# Patient Record
Sex: Female | Born: 1995 | Race: White | Hispanic: No | Marital: Married | Smoking: Never smoker
Health system: Southern US, Community
[De-identification: ages and names within clinical notes are randomized; demographics above are authoritative.]

## PROBLEM LIST (undated history)

## (undated) ENCOUNTER — Inpatient Hospital Stay: Payer: Self-pay

## (undated) DIAGNOSIS — G43909 Migraine, unspecified, not intractable, without status migrainosus: Secondary | ICD-10-CM

## (undated) DIAGNOSIS — R0989 Other specified symptoms and signs involving the circulatory and respiratory systems: Secondary | ICD-10-CM

## (undated) DIAGNOSIS — E282 Polycystic ovarian syndrome: Secondary | ICD-10-CM

## (undated) DIAGNOSIS — R202 Paresthesia of skin: Secondary | ICD-10-CM

## (undated) DIAGNOSIS — O24419 Gestational diabetes mellitus in pregnancy, unspecified control: Secondary | ICD-10-CM

## (undated) DIAGNOSIS — R200 Anesthesia of skin: Secondary | ICD-10-CM

## (undated) HISTORY — DX: Gestational diabetes mellitus in pregnancy, unspecified control: O24.419

## (undated) HISTORY — DX: Polycystic ovarian syndrome: E28.2

## (undated) HISTORY — DX: Paresthesia of skin: R20.2

## (undated) HISTORY — DX: Other specified symptoms and signs involving the circulatory and respiratory systems: R09.89

## (undated) HISTORY — DX: Migraine, unspecified, not intractable, without status migrainosus: G43.909

## (undated) HISTORY — DX: Anesthesia of skin: R20.0

---
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
## 2015-02-15 ENCOUNTER — Encounter: Payer: Self-pay | Admitting: Emergency Medicine

## 2015-02-15 ENCOUNTER — Ambulatory Visit
Admission: EM | Admit: 2015-02-15 | Discharge: 2015-02-15 | Disposition: A | Discharge status: Other Acute Inpatient Hospital | Payer: BLUE CROSS/BLUE SHIELD | Attending: Family Medicine | Admitting: Family Medicine

## 2015-02-15 ENCOUNTER — Emergency Department: Payer: BLUE CROSS/BLUE SHIELD

## 2015-02-15 ENCOUNTER — Emergency Department
Admission: EM | Admit: 2015-02-15 | Discharge: 2015-02-15 | Disposition: A | Discharge status: Home / Self Care | Payer: BLUE CROSS/BLUE SHIELD | Attending: Emergency Medicine | Admitting: Emergency Medicine

## 2015-02-15 DIAGNOSIS — N939 Abnormal uterine and vaginal bleeding, unspecified: Secondary | ICD-10-CM

## 2015-02-15 DIAGNOSIS — R102 Pelvic and perineal pain: Secondary | ICD-10-CM

## 2015-02-15 DIAGNOSIS — Z880 Allergy status to penicillin: Secondary | ICD-10-CM | POA: Insufficient documentation

## 2015-02-15 DIAGNOSIS — N938 Other specified abnormal uterine and vaginal bleeding: Secondary | ICD-10-CM

## 2015-02-15 LAB — WET PREP, GENITAL
Clue Cells Wet Prep HPF POC: NONE SEEN
SPERM: NONE SEEN
TRICH WET PREP: NONE SEEN
Yeast Wet Prep HPF POC: NONE SEEN

## 2015-02-15 LAB — CBC WITH DIFFERENTIAL/PLATELET
BASOS ABS: 0.1 10*3/uL (ref 0–0.1)
BASOS PCT: 1 %
EOS PCT: 0 %
Eosinophils Absolute: 0 10*3/uL (ref 0–0.7)
HCT: 43.8 % (ref 35.0–47.0)
Hemoglobin: 14.8 g/dL (ref 12.0–16.0)
Lymphocytes Relative: 19 %
Lymphs Abs: 1.8 10*3/uL (ref 1.0–3.6)
MCH: 30.2 pg (ref 26.0–34.0)
MCHC: 33.8 g/dL (ref 32.0–36.0)
MCV: 89.4 fL (ref 80.0–100.0)
MONO ABS: 0.6 10*3/uL (ref 0.2–0.9)
Monocytes Relative: 6 %
NEUTROS ABS: 6.8 10*3/uL — AB (ref 1.4–6.5)
Neutrophils Relative %: 74 %
PLATELETS: 228 10*3/uL (ref 150–440)
RBC: 4.9 MIL/uL (ref 3.80–5.20)
RDW: 13.2 % (ref 11.5–14.5)
WBC: 9.3 10*3/uL (ref 3.6–11.0)

## 2015-02-15 LAB — BASIC METABOLIC PANEL
ANION GAP: 7 (ref 5–15)
BUN: 9 mg/dL (ref 6–20)
CALCIUM: 9.4 mg/dL (ref 8.9–10.3)
CO2: 25 mmol/L (ref 22–32)
Chloride: 107 mmol/L (ref 101–111)
Creatinine, Ser: 0.63 mg/dL (ref 0.44–1.00)
Glucose, Bld: 92 mg/dL (ref 65–99)
Potassium: 4 mmol/L (ref 3.5–5.1)
Sodium: 139 mmol/L (ref 135–145)

## 2015-02-15 LAB — URINALYSIS COMPLETE WITH MICROSCOPIC (ARMC ONLY)
BILIRUBIN URINE: NEGATIVE
Bacteria, UA: NONE SEEN
GLUCOSE, UA: NEGATIVE mg/dL
Ketones, ur: NEGATIVE mg/dL
LEUKOCYTES UA: NEGATIVE
NITRITE: NEGATIVE
Protein, ur: NEGATIVE mg/dL
SPECIFIC GRAVITY, URINE: 1.013 (ref 1.005–1.030)
pH: 8 (ref 5.0–8.0)

## 2015-02-15 LAB — CHLAMYDIA/NGC RT PCR (ARMC ONLY)
Chlamydia Tr: NOT DETECTED
N GONORRHOEAE: NOT DETECTED

## 2015-02-15 LAB — PREGNANCY, URINE: PREG TEST UR: NEGATIVE

## 2015-02-15 MED ORDER — OXYCODONE-ACETAMINOPHEN 5-325 MG PO TABS
1.0000 | ORAL_TABLET | Freq: Once | ORAL | Status: AC
  Administered 2015-02-15: 1 via ORAL
  Filled 2015-02-15: qty 1

## 2015-02-15 NOTE — ED Notes (Signed)
Pharmacy called and notified of need of percocet/roxicet. States they will restock it.

## 2015-02-15 NOTE — ED Provider Notes (Signed)
Patient presents today with symptoms of left-sided pelvic cramping and vaginal bleeding. She has been passing large blood clots. Patient states that the symptoms started earlier this morning. She states that she does have a history of "cysts on her ovaries" which she often gets pain from but doesn't have bleeding from.  he states this was discovered when she had her appendectomy. She states her last menstrual period was on 02/03/2015 and lasted to 02/07/2015. She uses the NuvaRing for contraception. She admits to having periods every month. After the incident that happened earlier this morning she took the neighboring out thinking it was a result of the NuvaRing. She denies using any other medications. She is sexually active. She denies any chest pain, shortness of breath, nausea, vomiting, diarrhea, severe abdominal pain or flank pain, urinary symptoms, vaginal discharge.  ROS: Negative except mentioned above.  Vitals as per Epic.  GENERAL: NAD RESP: CTA B CARD: RRR ABD: +BS, mild tenderness along left pelvic area, no rebound or guarding  NEURO: CN II-XII grossly intact   A/P: Pelvic Pain, Bleeding- Recommend transvaginal US to evaluate further. Patient will have to go to the ER to have this done and for any further workup/treatment. Patient will go at this time to [facility].   Jolene Provost, MD 02/15/15 1352

## 2015-02-15 NOTE — ED Notes (Addendum)
Pt had normal menstrual cycle last week.  Then began with lower abdominal cramping today and vaginal bleeding.  Pt has used 4 pads today.  Has seen clots.  Pt was sent from [facility] urgent care for [place].

## 2015-02-15 NOTE — ED Provider Notes (Signed)
[facility] Emergency Department Provider Note  ____________________________________________  Time seen: Approximately 540 PM  I have reviewed the triage vital signs and the nursing notes.   HISTORY  Chief Complaint Vaginal Bleeding    HPI Christine Arroyo is a 20 y.o. female with a history of an appendectomy and ovarian cysts who is presenting with left lower quadrant cramping abdominal pain and vaginal bleeding since this morning. She says that she has been passing clots on and off all day today. She denies any chest pain, shortness of breath or feeling that she will pass out. She says that she had a normal period earlier this month from the ninth through the 13th. She uses her NuvaRing as directed and change that every month. After she began having bleeding this morning though she did take out the nuvaring.  She says the pain is a 6 out of 10 and has been radiating down her left leg.   History reviewed. No pertinent past medical history.  There are no active problems to display for this patient.   Past Surgical History  Procedure Laterality Date  . Appendectomy      Current Outpatient Rx  Name  Route  Sig  Dispense  Refill  . etonogestrel-ethinyl estradiol (NUVARING) 0.12-0.015 MG/24HR vaginal ring   Vaginal   Place 1 each vaginally every 28 (twenty-eight) days. Insert vaginally and leave in place for 3 consecutive weeks, then remove for 1 week.           Allergies Amoxicillin; Azithromycin; Keflex; Morphine and related; Penicillins; and Robitussin (alcohol free)  History reviewed. No pertinent family history.  Social History Social History  Substance Use Topics  . Smoking status: Never Smoker   . Smokeless tobacco: None  . Alcohol Use: No    Review of Systems Constitutional: No fever/chills Eyes: No visual changes. ENT: No sore throat. Cardiovascular: Denies chest pain. Respiratory: Denies shortness of breath. Gastrointestinal:  No  nausea, no vomiting.  No diarrhea.  No constipation. Genitourinary: Negative for dysuria. Musculoskeletal: Negative for back pain. Skin: Negative for rash. Neurological: Negative for headaches, focal weakness or numbness.  10-point ROS otherwise negative.  ____________________________________________   PHYSICAL EXAM:  VITAL SIGNS: ED Triage Vitals  Enc Vitals Group     BP 02/15/15 1424 131/78 mmHg     Pulse Rate 02/15/15 1424 75     Resp 02/15/15 1424 16     Temp 02/15/15 1424 98.3 F (36.8 C)     Temp Source 02/15/15 1424 Oral     SpO2 02/15/15 1424 100 %     Weight 02/15/15 1424 122 lb (55.339 kg)     Height 02/15/15 1424  (1.575 m)     Head Cir --      Peak Flow --      Pain Score 02/15/15 1431 6     Pain Loc --      Pain Edu? --      Excl. in GC? --     Constitutional: Alert and oriented. Well appearing and in no acute distress. Eyes: Conjunctivae are normal. PERRL. EOMI. Head: Atraumatic. Nose: No congestion/rhinnorhea. Mouth/Throat: Mucous membranes are moist.  Neck: No stridor.   Cardiovascular: Normal rate, regular rhythm. Grossly normal heart sounds.  Good peripheral circulation. Respiratory: Normal respiratory effort.  No retractions. Lungs CTAB. Gastrointestinal: Soft with tenderness palpation to the left lower quadrant. No distention. No abdominal bruits. No CVA tenderness. Genitourinary: Normal external exam. Speculum exam with very small amount of blood  in the vault. No active bleeding from the cervix. Bimanual exam without cervical motion tenderness. No uterine or right adnexal tenderness to palpation. Left adnexal tenderness to palpation without a palpable mass. Musculoskeletal: No lower extremity tenderness nor edema.  No joint effusions. Neurologic:  Normal speech and language. No gross focal neurologic deficits are appreciated. No gait instability. Skin:  Skin is warm, dry and intact. No rash noted. Psychiatric: Mood and affect are normal. Speech  and behavior are normal.  ____________________________________________   LABS (all labs ordered are listed, but only abnormal results are displayed)  Labs Reviewed  WET PREP, GENITAL - Abnormal; Notable for the following:    WBC, Wet Prep HPF POC MODERATE (*)    All other components within normal limits  CBC WITH DIFFERENTIAL/PLATELET - Abnormal; Notable for the following:    Neutro Abs 6.8 (*)    All other components within normal limits  URINALYSIS COMPLETEWITH MICROSCOPIC ([facility] ONLY) - Abnormal; Notable for the following:    Color, Urine YELLOW (*)    APPearance HAZY (*)    Hgb urine dipstick 3+ (*)    Squamous Epithelial / LPF 6-30 (*)    All other components within normal limits  CHLAMYDIA/NGC RT PCR ([facility] ONLY)  BASIC METABOLIC PANEL   ____________________________________________  EKG   ____________________________________________  RADIOLOGY  Normal pelvic ultrasound. ____________________________________________   PROCEDURES   ____________________________________________   INITIAL IMPRESSION / ASSESSMENT AND PLAN / ED COURSE  Pertinent labs & imaging results that were available during my care of the patient were reviewed by me and considered in my medical decision making (see chart for details).  ----------------------------------------- 9:34 PM on 02/15/2015 -----------------------------------------     Patient is resting without any distress at this time. Consultation about her lab results as well as her imaging results. I believe she is having dysfunctional uterine bleeding. The patient says she has not passed any more clots and she has been here in the emergency department. We will give her pain medication as requested before she leaves. However, I advised her to take ibuprofen at home for pain relief. Patient understands that she should follow-up with her OB/GYN at [facility] as soon as  possible. ____________________________________________   FINAL CLINICAL IMPRESSION(S) / ED DIAGNOSES  Final diagnoses:  Female pelvic pain  Female pelvic pain   dysfunctional uterine bleeding.    Myrna Blazer, MD 02/15/15 2137

## 2015-02-15 NOTE — ED Notes (Signed)
Patient c/o abdominal cramps and vaginal bleeding that started this morning.  Patient reports that she had her menstrual cycle last week and was a normal cycle.  Patient reports passing golf sized blood clots this morning.

## 2015-07-18 ENCOUNTER — Other Ambulatory Visit: Payer: Self-pay | Admitting: Neurology

## 2015-07-18 DIAGNOSIS — R202 Paresthesia of skin: Principal | ICD-10-CM

## 2015-07-18 DIAGNOSIS — R200 Anesthesia of skin: Secondary | ICD-10-CM

## 2015-08-05 ENCOUNTER — Ambulatory Visit: Payer: BLUE CROSS/BLUE SHIELD | Admitting: Internal Medicine

## 2015-08-08 ENCOUNTER — Other Ambulatory Visit: Payer: BLUE CROSS/BLUE SHIELD

## 2015-08-08 ENCOUNTER — Ambulatory Visit: Payer: BLUE CROSS/BLUE SHIELD

## 2015-08-12 ENCOUNTER — Inpatient Hospital Stay: Payer: BLUE CROSS/BLUE SHIELD | Attending: Internal Medicine | Admitting: Internal Medicine

## 2015-08-12 ENCOUNTER — Encounter: Payer: Self-pay | Admitting: Internal Medicine

## 2015-08-12 VITALS — BP 113/79 | HR 69 | Temp 99.0°F | Resp 18 | Wt 126.8 lb

## 2015-08-12 DIAGNOSIS — R2681 Unsteadiness on feet: Secondary | ICD-10-CM | POA: Insufficient documentation

## 2015-08-12 DIAGNOSIS — D890 Polyclonal hypergammaglobulinemia: Secondary | ICD-10-CM | POA: Diagnosis not present

## 2015-08-12 DIAGNOSIS — R200 Anesthesia of skin: Secondary | ICD-10-CM | POA: Diagnosis not present

## 2015-08-12 DIAGNOSIS — R202 Paresthesia of skin: Secondary | ICD-10-CM | POA: Diagnosis not present

## 2015-08-12 DIAGNOSIS — Z803 Family history of malignant neoplasm of breast: Secondary | ICD-10-CM | POA: Insufficient documentation

## 2015-08-12 NOTE — Progress Notes (Signed)
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  Referred by Dr. Sherryll BurgerShah.  Patient states she has numbness and tingling in bilateral lower extremities from her waist down. Also states she is having some numbness and tingling in left arm.  States it is better.  Unable to sleep at night due to restless legs.

## 2015-08-12 NOTE — Assessment & Plan Note (Signed)
IgM elevated at 440; polyclonal; no evidence of M spike on immunofixation/protein electrophoresis. Likely reactive/ underlying inflammatory process. I would not recommend any further workup like bone marrow biopsy; or any further workup for underlying malignancy at this time. This was thoroughly discussed the patient and family. They agree.  # Mildly elevated hemoglobin/hematocrit- I'm not suspicious of any polycythemia-like process at this time. If this continues to be elevated recommend further workup.  # I also discussed the above recommendations with Dr. Manuella Ghazi.   # No follow-up appointments made.  Thank you Dr.Shah for allowing me to participate in the care of your pleasant patient. Please do not hesitate to contact me with questions or concerns in the interim.  # 30 minutes face-to-face with the patient discussing the above plan of care; more than 50% of time spent on counseling and coordination.

## 2015-08-12 NOTE — Progress Notes (Signed)
[facility] CONSULT NOTE  Patient Care Team: No Pcp Per Patient as PCP - General (General Practice)  CHIEF COMPLAINTS/PURPOSE OF CONSULTATION:   # June 2017- Elevated IgM- polyclonal; no M spike  # Slightly elevated Hb/Hct [15.9/47.3]  HISTORY OF PRESENTING ILLNESS:  Christine Arroyo 20 y.o.  female with a history of tingling and numbness of the extremities; gait difficulty- is currently being worked up by neurology for the etiology. As a part of the workup patient was found to have elevated IgM. Referred to [place] for further evaluation.  Patient continues to complain of tingling and numbness of the left upper extremity; and also of the bilateral lower extremities. She has gait difficulty.  She denies any weight loss. Denies any loss of appetite. Denies any lumps or bumps. Denies any significant night sweats.   ROS: A complete 10 point review of system is done which is negative except mentioned above in history of present illness  MEDICAL HISTORY:  Past Medical History  Diagnosis Date  . Migraines   . Numbness and tingling of both legs   . Poor circulation of extremity ([place])     SURGICAL HISTORY: Past Surgical History  Procedure Laterality Date  . 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 No smoking or alcohol.  Social History   Social History  . Marital Status: Married    Spouse Name: N/A  . Number of Children: N/A  . Years of Education: N/A   Occupational History  . Not on file.   Social History Main Topics  . Smoking status: Never Smoker   . Smokeless tobacco: Not on file  . Alcohol Use: No  . Drug Use: Not on file  . Sexual Activity: Not on file   Other Topics Concern  . Not on file   Social History Narrative    FAMILY HISTORY: mother had breast cancer in 40s/ survived.  No family history on file.  ALLERGIES:  is allergic to amoxicillin; azithromycin; keflex; morphine and related; penicillins; and  robitussin (alcohol free).  MEDICATIONS:  No current outpatient prescriptions on file.   No current facility-administered medications for this visit.    Marland Kitchen  PHYSICAL EXAMINATION: ECOG PERFORMANCE STATUS: 0 - Asymptomatic  Filed Vitals:   08/12/15 0843  BP: 113/79  Pulse: 69  Temp: 99 F (37.2 C)  Resp: 18   Filed Weights   08/12/15 0843  Weight: 126 lb 12.2 oz (57.5 kg)    GENERAL: Well-nourished well-developed; Alert, no distress and comfortable.  With family.  EYES: no pallor or icterus OROPHARYNX: no thrush or ulceration; good dentition  NECK: supple, no masses felt LYMPH:  no palpable lymphadenopathy in the cervical, axillary or inguinal regions LUNGS: clear to auscultation and  No wheeze or crackles HEART/CVS: regular rate & rhythm and no murmurs; No lower extremity edema ABDOMEN: abdomen soft, non-tender and normal bowel sounds Musculoskeletal:no cyanosis of digits and no clubbing  PSYCH: alert & oriented x 3 with fluent speech NEURO: no focal motor/sensory deficits SKIN:  no rashes or significant lesions  LABORATORY DATA:  I have reviewed the data as listed Lab Results  Component Value Date   WBC 9.3 02/15/2015   HGB 14.8 02/15/2015   HCT 43.8 02/15/2015   MCV 89.4 02/15/2015   PLT 228 02/15/2015    Recent Labs  02/15/15 1442  NA 139  K 4.0  CL 107  CO2 25  GLUCOSE 92  BUN 9  CREATININE 0.63  CALCIUM 9.4  GFRNONAA >60  GFRAA >60    RADIOGRAPHIC STUDIES: I have personally reviewed the radiological images as listed and agreed with the findings in the report. No results found.  ASSESSMENT & PLAN:  Polyclonal gammopathy IgM elevated at 440; polyclonal; no evidence of M spike on immunofixation/protein electrophoresis. Likely reactive/ underlying inflammatory process. I would not recommend any further workup like bone marrow biopsy; or any further workup for underlying malignancy at this time. This was thoroughly discussed the patient and family.  They agree.  # Mildly elevated hemoglobin/hematocrit- I'm not suspicious of any polycythemia-like process at this time. If this continues to be elevated recommend further workup.  # I also discussed the above recommendations with Dr. Manuella Ghazi.   # No follow-up appointments made.  Thank you Dr.Shah for allowing me to participate in the care of your pleasant patient. Please do not hesitate to contact me with questions or concerns in the interim.  # 30 minutes face-to-face with the patient discussing the above plan of care; more than 50% of time spent on counseling and coordination.    All questions were answered. The patient knows to call the clinic with any problems, questions or concerns.    Cammie Sickle, MD 08/12/2015 9:16 AM

## 2015-12-08 ENCOUNTER — Ambulatory Visit
Admission: EM | Admit: 2015-12-08 | Discharge: 2015-12-08 | Discharge status: Absent without leave | Payer: BLUE CROSS/BLUE SHIELD

## 2016-09-28 IMAGING — US US PELVIS COMPLETE
1 series · 14 of 25 positions shown · non-contrast
Comparison: None.

CLINICAL DATA: Left-sided pelvic pain.

EXAM:
TRANSABDOMINAL AND TRANSVAGINAL ULTRASOUND OF PELVIS
DOPPLER ULTRASOUND OF OVARIES
TECHNIQUE: Both transabdominal and transvaginal ultrasound examinations of the
pelvis were performed. Transabdominal technique was performed for
global imaging of the pelvis including uterus, ovaries, adnexal
regions, and pelvic cul-de-sac.
It was necessary to proceed with endovaginal exam following the
transabdominal exam to visualize the endometrium and ovaries. Color
and duplex Doppler ultrasound was utilized to evaluate blood flow to
the ovaries.

[Series 1: us pelvis complete · 0.20mm/px · 14 of 86 slices shown]
[im 1/86]
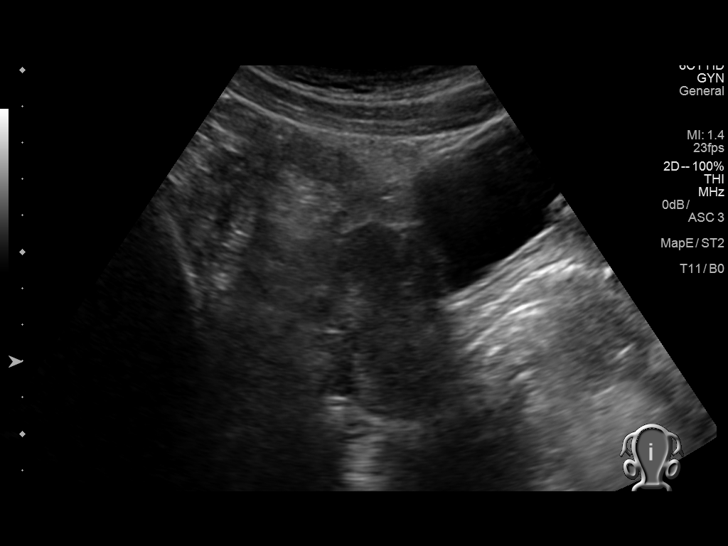
[im 8/86]
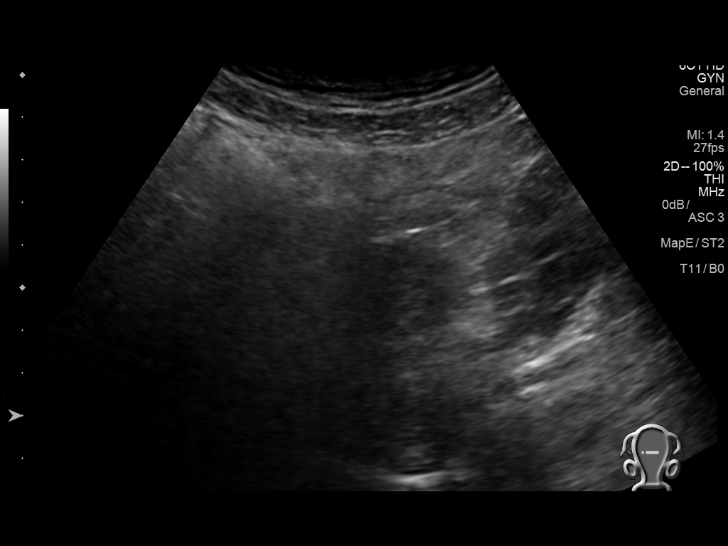
[im 15/86]
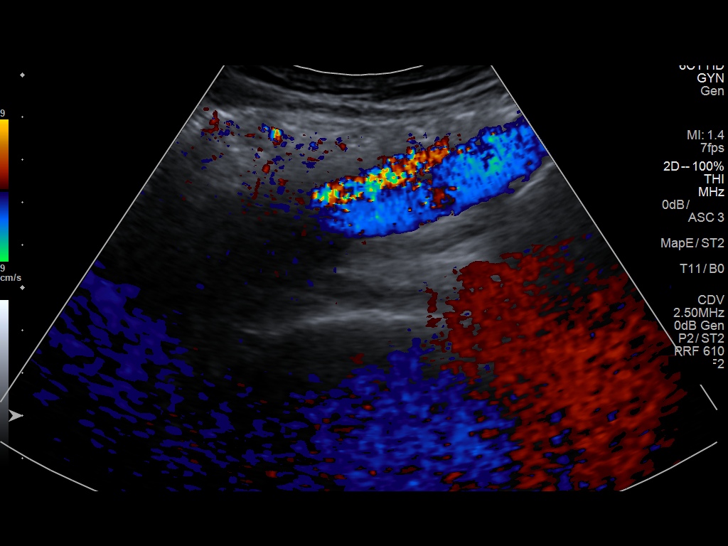
[im 22/86]
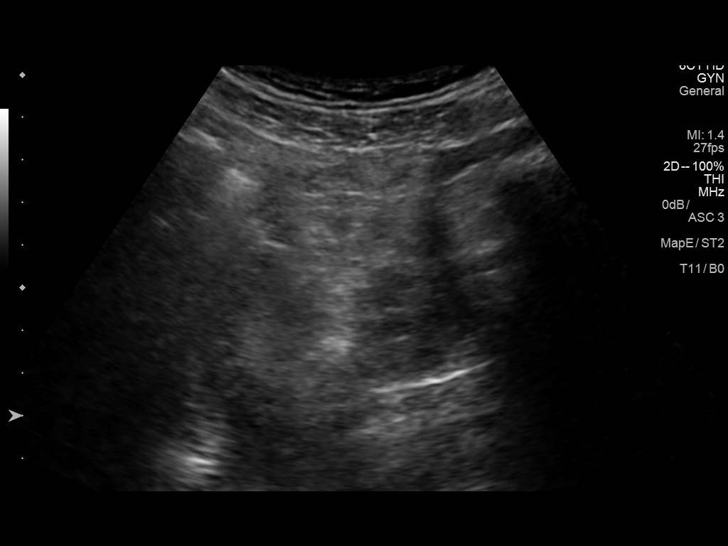
[im 29/86]
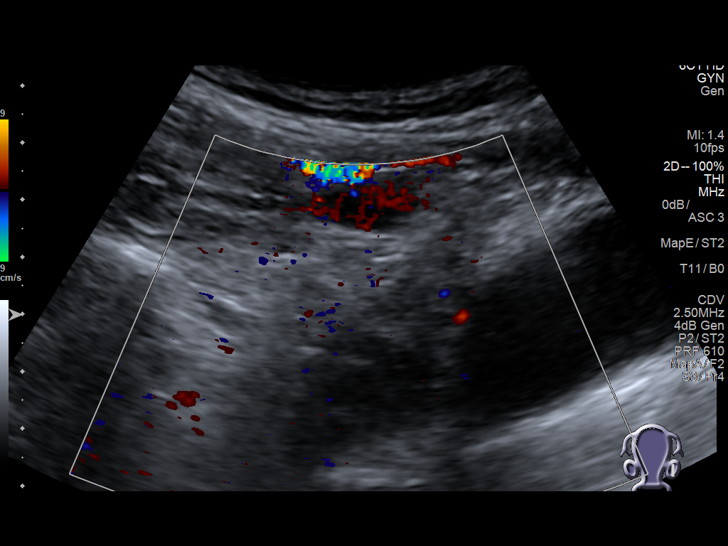
[im 32/86]
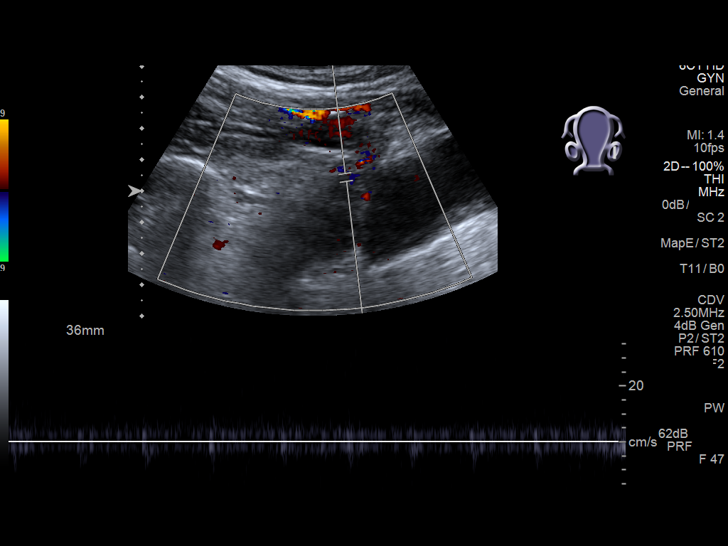
[im 39/86]
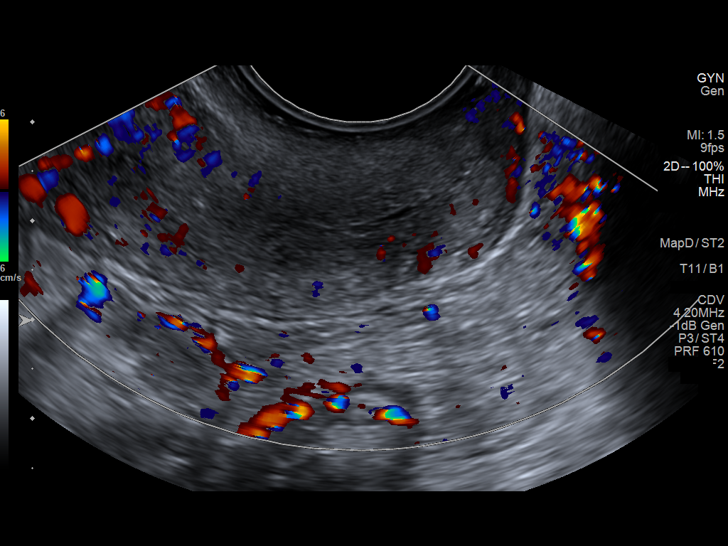
[im 47/86]
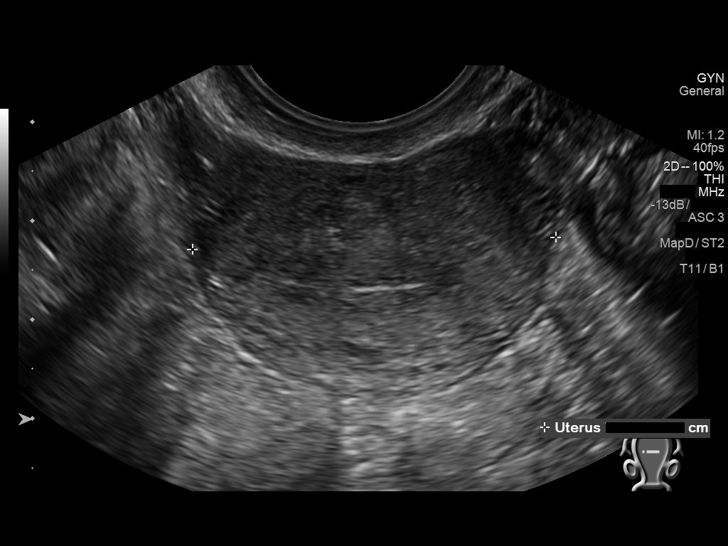
[im 54/86]
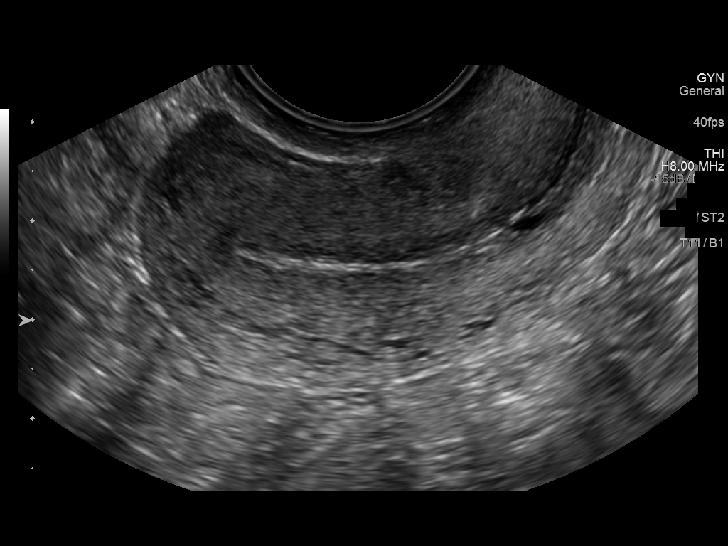
[im 57/86]
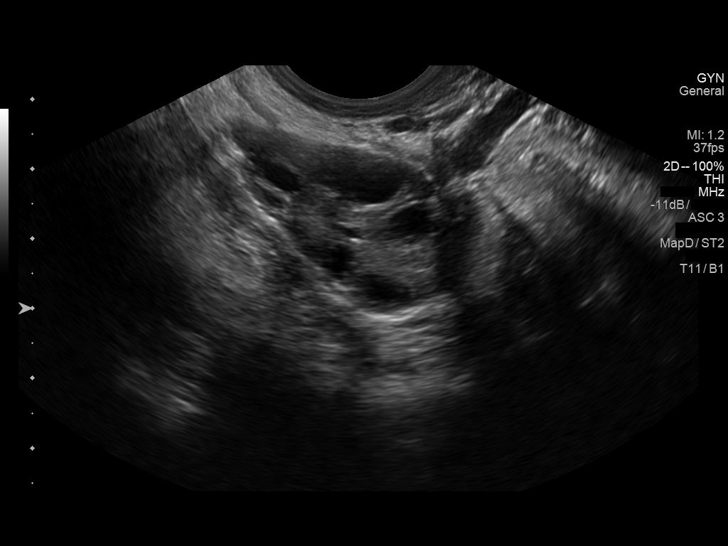
[im 64/86]
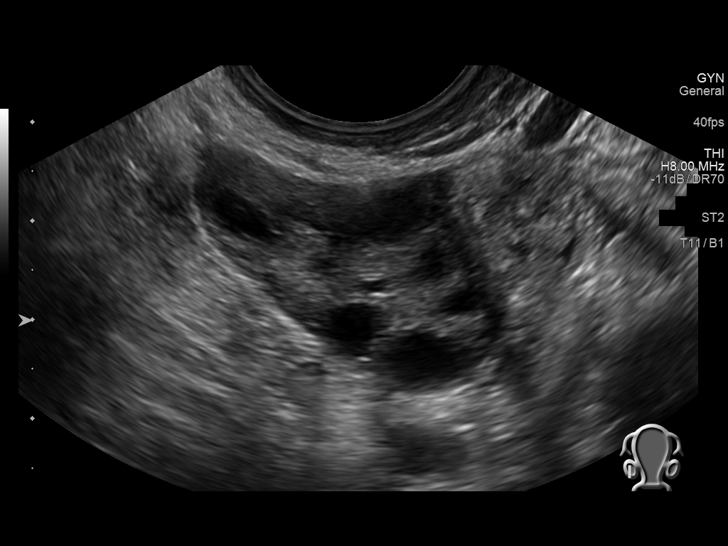
[im 71/86]
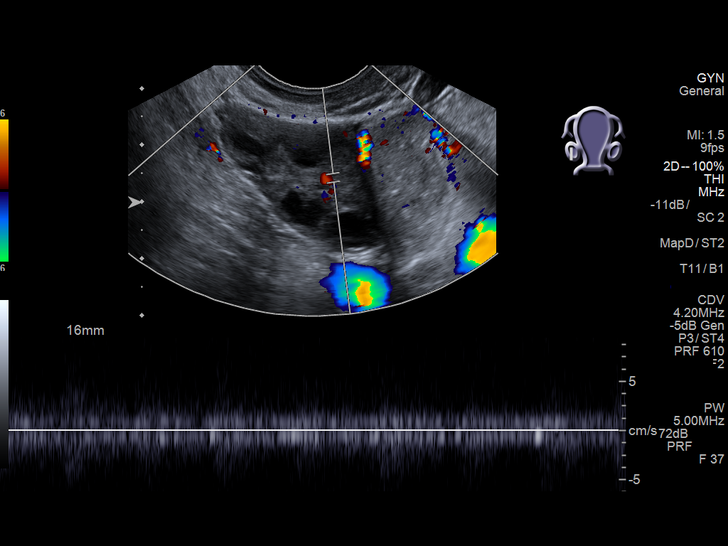
[im 78/86]
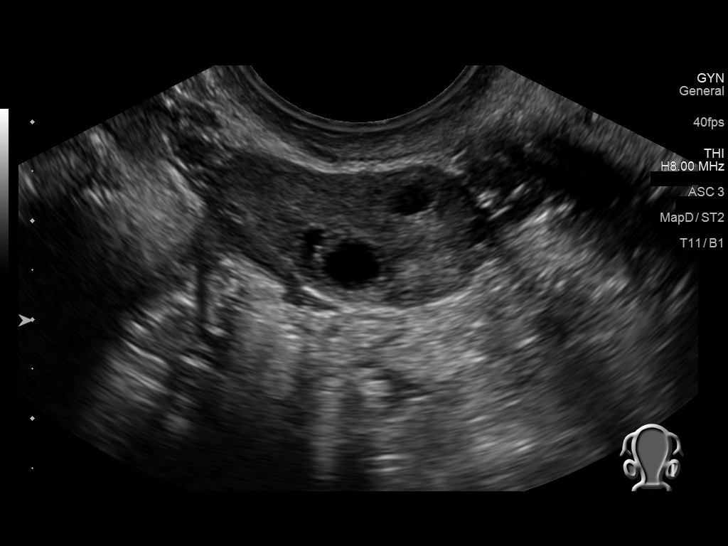
[im 86/86]
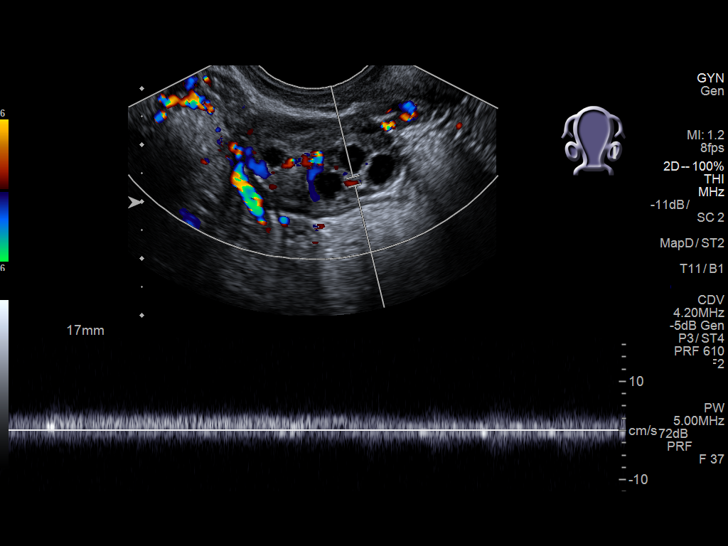

[14 of 25 positions shown; findings below may reference images not displayed]

FINDINGS: Uterus

Measurements: 5.7 x 3.4 x 2.9 cm. No fibroids or other mass
visualized.

Endometrium

Thickness: 2.2 mm.  No focal abnormality visualized.

Right ovary

Measurements: 3.7 x 2 x 2.7 cm. Normal appearance/no adnexal mass.

Left ovary

Measurements: 3.7 x 1.5 x 3.4 cm. Normal appearance/no adnexal mass.

Pulsed Doppler evaluation of both ovaries demonstrates normal
low-resistance arterial and venous waveforms.

Other findings

No abnormal free fluid.
IMPRESSION: Normal pelvic ultrasound.

## 2017-01-17 ENCOUNTER — Encounter: Payer: Self-pay | Admitting: Obstetrics and Gynecology

## 2017-01-17 ENCOUNTER — Ambulatory Visit (INDEPENDENT_AMBULATORY_CARE_PROVIDER_SITE_OTHER): Payer: BLUE CROSS/BLUE SHIELD | Admitting: Obstetrics and Gynecology

## 2017-01-17 VITALS — BP 106/72 | HR 80 | Wt 124.0 lb

## 2017-01-17 DIAGNOSIS — Z23 Encounter for immunization: Secondary | ICD-10-CM | POA: Diagnosis not present

## 2017-01-17 DIAGNOSIS — Z113 Encounter for screening for infections with a predominantly sexual mode of transmission: Secondary | ICD-10-CM

## 2017-01-17 DIAGNOSIS — O0993 Supervision of high risk pregnancy, unspecified, third trimester: Secondary | ICD-10-CM | POA: Insufficient documentation

## 2017-01-17 DIAGNOSIS — Z3400 Encounter for supervision of normal first pregnancy, unspecified trimester: Secondary | ICD-10-CM

## 2017-01-17 NOTE — Progress Notes (Signed)
NOB N&V 

## 2017-01-17 NOTE — Progress Notes (Signed)
01/17/2017   Chief Complaint: Missed period  Transfer of Care Patient: no  History of Present Illness: [name] is a 21 y.o. G1P0000 at 9weeks 0 days by her LMP. [facility]. She reports she had two doses of clomid and did not conceive. She had a month of no treatments and went to she her [facility] to discuss injections, but was found to be pregnant at that visit. She reports at that visit her EDD was 08/27/17 by ultrasound. The report was on care everywhere. Today on bedside US her due date was 08/26/17. Will keep EDD of 08/27/2017.  She has Positive signs or symptoms of nausea/vomiting of pregnancy. She has Negative signs or symptoms of miscarriage or preterm labor She identifies Negative Zika risk factors for her and her partner On any different medications around the time she conceived/early pregnancy: No  History of varicella: No   ROS: A 12-point review of systems was performed and negative, except as stated in the above HPI.  OBGYN History: As per HPI. OB History  Gravida Para Term Preterm AB Living  1 0 0     0  SAB TAB Ectopic Multiple Live Births               # Outcome Date GA Lbr Len/2nd Weight Sex Delivery Anes PTL Lv  1 Current               Any issues with any prior pregnancies: no Any prior children are healthy, doing well, without any problems or issues: not applicable History of pap smears: Yes. Last pap smear 07/25/2016 Normal per patient. Could not find in care everywhere.  History of STIs: No   Past Medical History: Past Medical History:  Diagnosis Date  . Migraines   . Numbness and tingling of both legs   . PCOS (polycystic ovarian syndrome)   . Poor circulation of extremity     Past Surgical History: Past Surgical History:  Procedure Laterality Date  . APPENDECTOMY  2011    Family History:  History reviewed. No pertinent family history. She denies any female cancers, bleeding or blood clotting disorders.   She denies any history of mental retardation, birth defects or genetic disorders in her or the FOB's history  Social History:  Social History   Socioeconomic History  . Marital status: Married    Spouse name: Not on file  . Number of children: Not on file  . Years of education: Not on file  . Highest education level: Not on file  Social Needs  . Financial resource strain: Not on file  . Food insecurity - worry: Not on file  . Food insecurity - inability: Not on file  . Transportation needs - medical: Not on file  . Transportation needs - non-medical: Not on file  Occupational History  . Not on file  Tobacco Use  . Smoking status: Never Smoker  . Smokeless tobacco: Never Used  Substance and Sexual Activity  . Alcohol use: No  . Drug use: No  . Sexual activity: Yes    Birth control/protection: None  Other Topics Concern  . Not on file  Social History Narrative  . Not on file   Any pets in the household: yes Cats, patient doe snot change cat litter.  Allergy: Allergies  Allergen Reactions  . Amoxicillin Hives  . Azithromycin Hives  . Keflex [Cephalexin] Hives  . Morphine And Related Hives  . Penicillins Hives  Has patient had a PCN reaction causing immediate rash, facial/tongue/throat swelling, SOB or lightheadedness with hypotension: Yes Has patient had a PCN reaction causing severe rash involving mucus membranes or skin necrosis: No Has patient had a PCN reaction that required hospitalization No Has patient had a PCN reaction occurring within the last 10 years: No If all of the above answers are "NO", then may proceed with Cephalosporin use.  . Robitussin (Alcohol Free) [Guaifenesin] Hives    Current Outpatient Medications: No current outpatient medications on file.   Physical Exam:   BP 106/72   Pulse 80   Wt 124 lb (56.2 kg)   LMP 11/15/2016 (Exact Date)   BMI 22.68 kg/m  Body mass index is 22.68 kg/m. Constitutional: Well nourished, well  developed female in no acute distress.  Neck:  Supple, normal appearance, and no thyromegaly  Cardiovascular: S1, S2 normal, no murmur, rub or gallop, regular rate and rhythm Respiratory:  Clear to auscultation bilateral. Normal respiratory effort Abdomen: positive bowel sounds and no masses, hernias; diffusely non tender to palpation, non distended Breasts: breasts appear normal, no suspicious masses, no skin or nipple changes or axillary nodes. Neuro/Psych:  Normal mood and affect.  Skin:  Warm and dry.  Lymphatic:  No inguinal lymphadenopathy.   Pelvic exam: is not limited by body habitus EGBUS: within normal limits, Vagina: within normal limits and with no blood in the vault, Cervix: normal appearing cervix without discharge or lesions, closed/long/high, Uterus:  enlarged: 10cm, and Adnexa:  normal adnexa and no mass, fullness, tenderness  Assessment: [name] is a 21 y.o. G1P0000 Unknown based on Patient's last menstrual period was 11/15/2016 (exact date). with an Estimated Date of Delivery: None noted.,  for prenatal care.  Plan:  1) Avoid alcoholic beverages. 2) Patient encouraged not to smoke.  3) Discontinue the use of all non-medicinal drugs and chemicals.  4) Take prenatal vitamins daily.  5) Seatbelt use advised 6) Nutrition, food safety (fish, cheese advisories, and high nitrite foods) and exercise discussed. 7) Hospital and practice style delivering at [facility] discussed  8) Patient is asked about travel to areas at risk for the Zika virus, and counseled to avoid travel and exposure to mosquitoes or sexual partners who may have themselves been exposed to the virus. Testing is discussed, and will be ordered as appropriate.  9) Childbirth classes at [facility] advised 10) Genetic Screening, such as with 1st Trimester Screening, cell free fetal DNA, AFP testing, and Ultrasound, as well as with amniocentesis and CVS as appropriate, is discussed with patient. She plans to discuss genetic  testing with her patner, options reviewed.   11) Flu shot today  Problem list reviewed and updated.  Adelene Idlerhristanna Mariangel Ringley, MD [facility], [facility] 01/17/2017  8:20 AM

## 2017-01-18 LAB — RPR+RH+ABO+RUB AB+AB SCR+CB...
ANTIBODY SCREEN: NEGATIVE
HEMATOCRIT: 42.1 % (ref 34.0–46.6)
HEMOGLOBIN: 14 g/dL (ref 11.1–15.9)
HIV Screen 4th Generation wRfx: NONREACTIVE
Hepatitis B Surface Ag: NEGATIVE
MCH: 31.1 pg (ref 26.6–33.0)
MCHC: 33.3 g/dL (ref 31.5–35.7)
MCV: 94 fL (ref 79–97)
Platelets: 256 10*3/uL (ref 150–379)
RBC: 4.5 x10E6/uL (ref 3.77–5.28)
RDW: 13.4 % (ref 12.3–15.4)
RPR Ser Ql: NONREACTIVE
RUBELLA: 2.61 {index} (ref >0.99)
Rh Factor: NEGATIVE
Varicella zoster IgG: <135 index — ABNORMAL LOW (ref >165)
WBC: 9.6 10*3/uL (ref 3.4–10.8)

## 2017-01-19 ENCOUNTER — Encounter: Payer: Self-pay | Admitting: Obstetrics and Gynecology

## 2017-01-19 LAB — URINE DRUG PANEL 7
Amphetamines, Urine: NEGATIVE ng/mL
Barbiturate Quant, Ur: NEGATIVE ng/mL
Benzodiazepine Quant, Ur: NEGATIVE ng/mL
Cannabinoid Quant, Ur: NEGATIVE ng/mL
Cocaine (Metab.): NEGATIVE ng/mL
Opiate Quant, Ur: NEGATIVE ng/mL
PCP QUANT UR: NEGATIVE ng/mL

## 2017-01-19 LAB — URINE CULTURE

## 2017-01-20 ENCOUNTER — Other Ambulatory Visit: Payer: Self-pay | Admitting: Obstetrics and Gynecology

## 2017-01-20 DIAGNOSIS — O09891 Supervision of other high risk pregnancies, first trimester: Secondary | ICD-10-CM | POA: Insufficient documentation

## 2017-01-20 DIAGNOSIS — O219 Vomiting of pregnancy, unspecified: Secondary | ICD-10-CM | POA: Insufficient documentation

## 2017-01-20 DIAGNOSIS — Z283 Underimmunization status: Secondary | ICD-10-CM

## 2017-01-20 DIAGNOSIS — O26891 Other specified pregnancy related conditions, first trimester: Secondary | ICD-10-CM

## 2017-01-20 DIAGNOSIS — Z3400 Encounter for supervision of normal first pregnancy, unspecified trimester: Secondary | ICD-10-CM

## 2017-01-20 DIAGNOSIS — Z6791 Unspecified blood type, Rh negative: Secondary | ICD-10-CM

## 2017-01-20 MED ORDER — DOXYLAMINE SUCCINATE (SLEEP) 25 MG PO TABS: 25.0000 mg | ORAL_TABLET | Freq: Every day | ORAL | Status: DC

## 2017-01-20 MED ORDER — PROCHLORPERAZINE MALEATE 10 MG PO TABS: 10.0000 mg | ORAL_TABLET | Freq: Four times a day (QID) | ORAL | 3 refills | Status: DC | PRN

## 2017-01-20 MED ORDER — PYRIDOXINE HCL 25 MG PO TABS: 25.0000 mg | ORAL_TABLET | Freq: Four times a day (QID) | ORAL | 3 refills | Status: DC | PRN

## 2017-01-20 NOTE — Progress Notes (Signed)
Released to MyChart. Needs varicella vaccination postpartum

## 2017-01-21 LAB — NUSWAB VAGINITIS PLUS (VG+)
CANDIDA ALBICANS, NAA: NEGATIVE
Candida glabrata, NAA: NEGATIVE
Chlamydia trachomatis, NAA: NEGATIVE
NEISSERIA GONORRHOEAE, NAA: NEGATIVE
Trich vag by NAA: NEGATIVE

## 2017-01-21 NOTE — Progress Notes (Signed)
Released to MyChart, normal

## 2017-01-25 NOTE — L&D Delivery Note (Signed)
Delivery Note Vaginal Delivery Note  Spontaneous delivery of live viable female infant from the OA position through an intact perineum. Delivery of anterior left shoulder with gentle downward guidance followed by delivery of the right posterior shoulder with gentle upward guidance. Body followed spontaneously. Infant placed on maternal chest. Nursery present and helped with neonatal resuscitation and evaluation. Cord clamped and cut after one minute. Cord blood collected. Placenta delivered spontaneously and intact with a 3 vessel cord and a secondary lobe.  No lacerations. Uterus firm and below umbilicus at the end of the delivery.  Mom and baby recovering in stable condition. Sponge and needle counts were correct at the end of the delivery.  APGARS:  1 minute:8  5 minutes: 9 Weight: 6lbs 2 oz Anesthesia: Epidural  Adelene Idlerhristanna Schuman MD [facility], [facility] 07/25/17 12:53 AM

## 2017-01-26 ENCOUNTER — Encounter: Payer: Self-pay | Admitting: Obstetrics and Gynecology

## 2017-02-04 ENCOUNTER — Other Ambulatory Visit: Payer: Self-pay | Admitting: Advanced Practice Midwife

## 2017-02-04 DIAGNOSIS — Z3491 Encounter for supervision of normal pregnancy, unspecified, first trimester: Secondary | ICD-10-CM

## 2017-02-11 ENCOUNTER — Ambulatory Visit (INDEPENDENT_AMBULATORY_CARE_PROVIDER_SITE_OTHER): Payer: BLUE CROSS/BLUE SHIELD | Admitting: Advanced Practice Midwife

## 2017-02-11 ENCOUNTER — Encounter: Payer: Self-pay | Admitting: Advanced Practice Midwife

## 2017-02-11 ENCOUNTER — Ambulatory Visit (INDEPENDENT_AMBULATORY_CARE_PROVIDER_SITE_OTHER): Payer: BLUE CROSS/BLUE SHIELD

## 2017-02-11 ENCOUNTER — Other Ambulatory Visit: Payer: Self-pay | Admitting: Advanced Practice Midwife

## 2017-02-11 VITALS — BP 108/70 | Wt 122.0 lb

## 2017-02-11 DIAGNOSIS — O219 Vomiting of pregnancy, unspecified: Secondary | ICD-10-CM

## 2017-02-11 DIAGNOSIS — Z3400 Encounter for supervision of normal first pregnancy, unspecified trimester: Secondary | ICD-10-CM

## 2017-02-11 DIAGNOSIS — Z3491 Encounter for supervision of normal pregnancy, unspecified, first trimester: Secondary | ICD-10-CM

## 2017-02-11 DIAGNOSIS — Z3687 Encounter for antenatal screening for uncertain dates: Secondary | ICD-10-CM | POA: Diagnosis not present

## 2017-02-11 DIAGNOSIS — Z3A11 11 weeks gestation of pregnancy: Secondary | ICD-10-CM

## 2017-02-11 MED ORDER — PROMETHAZINE HCL 25 MG PO TABS: 25.0000 mg | ORAL_TABLET | Freq: Four times a day (QID) | ORAL | 2 refills | Status: DC | PRN

## 2017-02-11 NOTE — Progress Notes (Addendum)
  Routine Prenatal Care Visit  Subjective  Christine Arroyo is a 22 y.o. G1P0000 at 3516w6d being seen today for ongoing prenatal care.  She is currently monitored for the following issues for this high-risk pregnancy and has Polyclonal gammopathy; Supervision of normal first pregnancy, antepartum; Rh negative state in antepartum period, first trimester; Nausea and vomiting in pregnancy prior to [redacted] weeks gestation; and Susceptible to Varicella (non-immune), currently pregnant in first trimester on their problem list.  ----------------------------------------------------------------------------------- Patient reports nausea and vomiting.  She is unable to tolerate unisom and is requesting a different medication than bonjesta or diclegis. Contractions: Not present. Vag. Bleeding: None.   . Denies leaking of fluid.  ----------------------------------------------------------------------------------- The following portions of the patient's history were reviewed and updated as appropriate: allergies, current medications, past family history, past medical history, past social history, past surgical history and problem list. Problem list updated.   Objective  Blood pressure 108/70, weight 122 lb (55.3 kg), last menstrual period 11/15/2016. Pregravid weight 126 lb (57.2 kg) Total Weight Gain  (-1.814 kg) Urinalysis: Urine Protein: Negative Urine Glucose: Negative  Fetal Status: Fetal Heart Rate (bpm): 169         Ultrasound today was focused on dating and agrees with the patient's stated LMP of 11/15/2016- Gestational age 256w4d, EDD 08/22/2017. [facility]. The gestation measured 4918w5d on 01/06/17 with EDD of 08/27/2017. This is a 5 day difference from the dating by LMP and therefore per ACOG guidelines should not have been adjusted from the EDD of 08/22/2017. Per Dr Ellie LunchSchuman's note from previous visit we will continue with EDD of 08/27/2017 as established by [facility] based on her  bedside u/s measuring EDD at 08/26/2017. Per discussion with Dr Jean RosenthalJackson today we will continue with EDD per [facility] u/s.   General:  Alert, oriented and cooperative. Patient is in no acute distress.  Skin: Skin is warm and dry. No rash noted.   Cardiovascular: Normal heart rate noted  Respiratory: Normal respiratory effort, no problems with respiration noted  Abdomen: Soft, gravid, appropriate for gestational age. Pain/Pressure: Absent     Pelvic:  Cervical exam deferred        Extremities: Normal range of motion.  Edema: None  Mental Status: Normal mood and affect. Normal behavior. Normal judgment and thought content.   Assessment   21 y.o. G1P0000 at 7316w6d by  08/27/2017, by Ultrasound presenting for routine prenatal visit  Plan   pregnancy #1 Problems (from 11/15/16 to present)    Problem Noted Resolved   Rh negative state in antepartum period, first trimester 01/20/2017 by Natale MilchSchuman, Christanna R, MD No   Nausea and vomiting in pregnancy prior to [redacted] weeks gestation 01/20/2017 by Natale MilchSchuman, Christanna R, MD No   Susceptible to Varicella (non-immune), currently pregnant in first trimester 01/20/2017 by Natale MilchSchuman, Christanna R, MD No       Preterm labor symptoms and general obstetric precautions including but not limited to vaginal bleeding, contractions, leaking of fluid and fetal movement were reviewed in detail with the patient. Rx sent for Phenergan   Return in about 4 weeks (around 03/11/2017) for rob.  Tresea MallJane Evangelina Delancey, CNM  02/14/2017 10:39 AM

## 2017-02-11 NOTE — Progress Notes (Signed)
U/s today, a lot of nauseas

## 2017-03-11 ENCOUNTER — Ambulatory Visit (INDEPENDENT_AMBULATORY_CARE_PROVIDER_SITE_OTHER): Payer: BLUE CROSS/BLUE SHIELD | Admitting: Maternal Newborn

## 2017-03-11 ENCOUNTER — Encounter: Payer: Self-pay | Admitting: Maternal Newborn

## 2017-03-11 VITALS — BP 90/60 | Wt 121.0 lb

## 2017-03-11 DIAGNOSIS — Z3689 Encounter for other specified antenatal screening: Secondary | ICD-10-CM

## 2017-03-11 DIAGNOSIS — Z3400 Encounter for supervision of normal first pregnancy, unspecified trimester: Secondary | ICD-10-CM

## 2017-03-11 DIAGNOSIS — Z3A15 15 weeks gestation of pregnancy: Secondary | ICD-10-CM

## 2017-03-11 NOTE — Progress Notes (Signed)
Routine Prenatal Care Visit  Subjective  Christine Arroyo is a 22 y.o. G1P0000 at [redacted]w[redacted]d being seen today for ongoing prenatal care.  She is currently monitored for the following issues for this low-risk pregnancy and has Polyclonal gammopathy; Supervision of normal first pregnancy, antepartum; Rh negative state in antepartum period, first trimester; Nausea and vomiting in pregnancy prior to [redacted] weeks gestation; and Susceptible to Varicella (non-immune), currently pregnant in first trimester on their problem list.  ----------------------------------------------------------------------------------- Patient reports no complaints.   Contractions: Not present. Vag. Bleeding: None.  Movement: Present. Denies leaking of fluid.  ----------------------------------------------------------------------------------- The following portions of the patient's history were reviewed and updated as appropriate: allergies, current medications, past family history, past medical history, past social history, past surgical history and problem list. Problem list updated.   Objective  Blood pressure 90/60, weight 121 lb (54.9 kg), last menstrual period 11/15/2016. Pregravid weight 126 lb (57.2 kg) Total Weight Gain  (-2.268 kg) Urinalysis: Urine Protein: Negative Urine Glucose: Negative  Fetal Status: Fetal Heart Rate (bpm): 158   Movement: Present     General:  Alert, oriented and cooperative. Patient is in no acute distress.  Skin: Skin is warm and dry. No rash noted.   Cardiovascular: Normal heart rate noted  Respiratory: Normal respiratory effort, no problems with respiration noted  Abdomen: Soft, gravid, appropriate for gestational age. Pain/Pressure: Absent     Pelvic:  Cervical exam deferred        Extremities: Normal range of motion.  Edema: None  Mental Status: Normal mood and affect. Normal behavior. Normal judgment and thought content.     Assessment   21 y.o. G1P0000 at [redacted]w[redacted]d, EDD8/03/2017 by  Ultrasound presenting for routine prenatal visit.  Plan   pregnancy #1 Problems (from 11/15/16 to present)    Problem Noted Resolved   Rh negative state in antepartum period, first trimester 01/20/2017 by Natale Milch, MD No   Nausea and vomiting in pregnancy prior to [redacted] weeks gestation 01/20/2017 by Natale Milch, MD No   Susceptible to Varicella (non-immune), currently pregnant in first trimester 01/20/2017 by Natale Milch, MD No   Supervision of normal first pregnancy, antepartum 01/17/2017 by Natale Milch, MD No   Overview Addendum 02/14/2017 10:38 AM by Tresea Mall, CNM      [facility] Prenatal Labs  Dating  Blood type: O/Negative/-- (12/24 0910)   Genetic Screen 1 Screen:     AFP:      Quad:      NIPS:    Antibody:Negative (12/24 0910)  Anatomic [place]  Rubella: 2.61 (12/24 0910)  Varicella: Nonimmune  GTT Early:        28 wk:      RPR: Non Reactive (12/24 0910)   Rhogam   HBsAg: Negative (12/24 0910)   TDaP vaccine                       HIV: Non Reactive (12/24 0910)   Flu Shot                                GBS:   Contraception  Pap:  CBB     CS/VBAC NA   Baby Food    Support Person  Husband Vincenza Hews            Discussed next visit/anatomy ultrasound and what to expect for upcoming appointments.   Preterm labor symptoms  and general obstetric precautions including but not limited to vaginal bleeding, contractions, leaking of fluid and fetal movement were reviewed in detail with the patient.  Return in about 4 weeks (around 04/08/2017) for ROB and anatomy scan.  Marcelyn BruinsJacelyn Schmid, CNM 03/11/2017  11:42 AM

## 2017-04-08 ENCOUNTER — Encounter: Payer: Self-pay | Admitting: Maternal Newborn

## 2017-04-08 ENCOUNTER — Ambulatory Visit (INDEPENDENT_AMBULATORY_CARE_PROVIDER_SITE_OTHER): Payer: BLUE CROSS/BLUE SHIELD

## 2017-04-08 ENCOUNTER — Ambulatory Visit (INDEPENDENT_AMBULATORY_CARE_PROVIDER_SITE_OTHER): Payer: BLUE CROSS/BLUE SHIELD | Admitting: Maternal Newborn

## 2017-04-08 VITALS — BP 100/60 | Wt 126.0 lb

## 2017-04-08 DIAGNOSIS — Z3689 Encounter for other specified antenatal screening: Secondary | ICD-10-CM | POA: Diagnosis not present

## 2017-04-08 DIAGNOSIS — Z3A19 19 weeks gestation of pregnancy: Secondary | ICD-10-CM

## 2017-04-08 DIAGNOSIS — Z3400 Encounter for supervision of normal first pregnancy, unspecified trimester: Secondary | ICD-10-CM

## 2017-04-08 NOTE — Progress Notes (Signed)
No concerns.rj 

## 2017-04-08 NOTE — Progress Notes (Signed)
Routine Prenatal Care Visit  Subjective  Christine Arroyo is a 22 y.o. G1P0000 at 6537w6d being seen today for ongoing prenatal care.  She is currently monitored for the following issues for this low-risk pregnancy and has Polyclonal gammopathy; Supervision of normal first pregnancy, antepartum; Rh negative state in antepartum period, first trimester; Nausea and vomiting in pregnancy prior to [redacted] weeks gestation; and Susceptible to Varicella (non-immune), currently pregnant in first trimester on their problem list.  ----------------------------------------------------------------------------------- Patient reports no complaints.   Contractions: Not present. Vag. Bleeding: None.  Movement: Present. Denies leaking of fluid.  ----------------------------------------------------------------------------------- The following portions of the patient's history were reviewed and updated as appropriate: allergies, current medications, past family history, past medical history, past social history, past surgical history and problem list. Problem list updated.   Objective  Blood pressure 100/60, weight 126 lb (57.2 kg), last menstrual period 11/15/2016. Pregravid weight 126 lb (57.2 kg) Total Weight Gain 0 lb (0 kg) Urinalysis: Urine Protein: Negative Urine Glucose: Negative  Fetal Status: Fetal Heart Rate (bpm): 145   Movement: Present     General:  Alert, oriented and cooperative. Patient is in no acute distress.  Skin: Skin is warm and dry. No rash noted.   Cardiovascular: Normal heart rate noted  Respiratory: Normal respiratory effort, no problems with respiration noted  Abdomen: Soft, gravid, appropriate for gestational age. Pain/Pressure: Absent     Pelvic:  Cervical exam deferred        Extremities: Normal range of motion.  Edema: None  Mental Status: Normal mood and affect. Normal behavior. Normal judgment and thought content.     Assessment   22 y.o. G1P0000 at 6137w6d, EDD 08/27/2017 by  Ultrasound presenting for routine prenatal visit.  Plan   pregnancy #1 Problems (from 11/15/16 to present)    Problem Noted Resolved   Rh negative state in antepartum period, first trimester 01/20/2017 by Natale MilchSchuman, Christanna R, MD No   Nausea and vomiting in pregnancy prior to [redacted] weeks gestation 01/20/2017 by Natale MilchSchuman, Christanna R, MD No   Susceptible to Varicella (non-immune), currently pregnant in first trimester 01/20/2017 by Natale MilchSchuman, Christanna R, MD No   Supervision of normal first pregnancy, antepartum 01/17/2017 by Natale MilchSchuman, Christanna R, MD No   Overview Addendum 02/14/2017 10:38 AM by Tresea MallGledhill, Jane, CNM      [facility] Prenatal Labs  Dating  Blood type: O/Negative/-- (12/24 0910)   Genetic Screen 1 Screen:     AFP:      Quad:      NIPS:    Antibody:Negative (12/24 0910)  Anatomic US  Rubella: 2.61 (12/24 0910)  Varicella: Nonimmune  GTT Early:        28 wk:      RPR: Non Reactive (12/24 0910)   Rhogam   HBsAg: Negative (12/24 0910)   TDaP vaccine                       HIV: Non Reactive (12/24 0910)   Flu Shot                                GBS:   Contraception  Pap:  CBB     CS/VBAC NA   Baby Food    Support Person  Husband Shane            Normal anatomy scan completed today. It's a boy!   Preterm labor symptoms and  general obstetric precautions were reviewed with the patient.  Return in about 4 weeks (around 05/06/2017) for ROB.  Marcelyn Bruins, CNM 04/08/2017  11:32 AM

## 2017-05-06 ENCOUNTER — Ambulatory Visit (INDEPENDENT_AMBULATORY_CARE_PROVIDER_SITE_OTHER): Payer: BLUE CROSS/BLUE SHIELD | Admitting: Maternal Newborn

## 2017-05-06 ENCOUNTER — Encounter: Payer: Self-pay | Admitting: Maternal Newborn

## 2017-05-06 VITALS — BP 100/70 | Wt 133.0 lb

## 2017-05-06 DIAGNOSIS — Z3400 Encounter for supervision of normal first pregnancy, unspecified trimester: Secondary | ICD-10-CM

## 2017-05-06 DIAGNOSIS — Z3A23 23 weeks gestation of pregnancy: Secondary | ICD-10-CM

## 2017-05-06 NOTE — Progress Notes (Signed)
Routine Prenatal Care Visit  Subjective  Christine Arroyo is a 22 y.o. G1P0000 at [redacted]w[redacted]d being seen today for ongoing prenatal care.  She is currently monitored for the following issues for this low-risk pregnancy and has Polyclonal gammopathy; Supervision of normal first pregnancy, antepartum; Rh negative state in antepartum period, first trimester; Nausea and vomiting in pregnancy prior to [redacted] weeks gestation; and Susceptible to Varicella (non-immune), currently pregnant in first trimester on their problem list.  ----------------------------------------------------------------------------------- Patient reports some swelling in her feet.  Resolves when she rests with her feet up. Contractions: Not present. Vag. Bleeding: None.  Movement: Present. Denies leaking of fluid.  ----------------------------------------------------------------------------------- The following portions of the patient's history were reviewed and updated as appropriate: allergies, current medications, past family history, past medical history, past social history, past surgical history and problem list. Problem list updated.   Objective  Last menstrual period 11/15/2016. Pregravid weight 126 lb (57.2 kg) Total Weight Gain 7 lb (3.175 kg) Urinalysis: Urine Protein: Negative Urine Glucose: Negative  Fetal Status: Fetal Heart Rate (bpm): 154   Movement: Present     General:  Alert, oriented and cooperative. Patient is in no acute distress.  Skin: Skin is warm and dry. No rash noted.   Cardiovascular: Normal heart rate noted  Respiratory: Normal respiratory effort, no problems with respiration noted  Abdomen: Soft, gravid, appropriate for gestational age. Pain/Pressure: Absent     Pelvic:  Cervical exam deferred        Extremities: Normal range of motion.  Edema: Trace  Mental Status: Normal mood and affect. Normal behavior. Normal judgment and thought content.     Assessment   22 y.o. G1P0000 at [redacted]w[redacted]d, EDD  08/27/2017 by Ultrasound presenting for routine prenatal visit.  Plan   pregnancy #1 Problems (from 11/15/16 to present)    Problem Noted Resolved   Rh negative state in antepartum period, first trimester 01/20/2017 by Natale Milch, MD No   Nausea and vomiting in pregnancy prior to [redacted] weeks gestation 01/20/2017 by Natale Milch, MD No   Susceptible to Varicella (non-immune), currently pregnant in first trimester 01/20/2017 by Natale Milch, MD No   Supervision of normal first pregnancy, antepartum 01/17/2017 by Natale Milch, MD No   Overview Addendum 04/08/2017 11:36 AM by Oswaldo Conroy, CNM      [facility] Prenatal Labs  Dating  Blood type: O/Negative/-- (12/24 0910)   Genetic Screen Declines   Antibody:Negative (12/24 0910)  Anatomic [place] Complete 04/08/2017 Rubella: 2.61 (12/24 0910)  Varicella: Nonimmune  GTT Early:        28 wk:      RPR: Non Reactive (12/24 0910)   Rhogam   HBsAg: Negative (12/24 0910)   TDaP vaccine                       HIV: Non Reactive (12/24 0910)   Flu Shot                                GBS:   Contraception  Pap: 07/25/2016  CBB     CS/VBAC NA   Baby Food    Support Person  Husband Shane            Advised compression stockings to help with relief of edema. Discussed GTT and labs/RhoGAM next visit. Gestational age appropriate obstetric precautions reviewed.  Return in about 1 month (around  06/03/2017) for ROB with GTT/28 week labs.  Marcelyn BruinsJacelyn Tammara Massing, CNM 05/06/2017  10:09 AM

## 2017-05-06 NOTE — Progress Notes (Signed)
No concerns.rj 

## 2017-05-30 ENCOUNTER — Observation Stay
Admission: EM | Admit: 2017-05-30 | Discharge: 2017-05-30 | Disposition: A | Discharge status: Home / Self Care | Payer: BLUE CROSS/BLUE SHIELD | Source: Ambulatory Visit | Attending: Obstetrics and Gynecology | Admitting: Obstetrics and Gynecology

## 2017-05-30 DIAGNOSIS — R109 Unspecified abdominal pain: Secondary | ICD-10-CM | POA: Diagnosis present

## 2017-05-30 DIAGNOSIS — Z3400 Encounter for supervision of normal first pregnancy, unspecified trimester: Secondary | ICD-10-CM

## 2017-05-30 DIAGNOSIS — Z880 Allergy status to penicillin: Secondary | ICD-10-CM | POA: Diagnosis not present

## 2017-05-30 DIAGNOSIS — Z881 Allergy status to other antibiotic agents status: Secondary | ICD-10-CM | POA: Diagnosis not present

## 2017-05-30 DIAGNOSIS — Z885 Allergy status to narcotic agent status: Secondary | ICD-10-CM | POA: Diagnosis not present

## 2017-05-30 DIAGNOSIS — Z3A27 27 weeks gestation of pregnancy: Secondary | ICD-10-CM | POA: Diagnosis not present

## 2017-05-30 DIAGNOSIS — O26891 Other specified pregnancy related conditions, first trimester: Secondary | ICD-10-CM

## 2017-05-30 DIAGNOSIS — E282 Polycystic ovarian syndrome: Secondary | ICD-10-CM | POA: Diagnosis not present

## 2017-05-30 DIAGNOSIS — Z283 Underimmunization status: Secondary | ICD-10-CM

## 2017-05-30 DIAGNOSIS — O99282 Endocrine, nutritional and metabolic diseases complicating pregnancy, second trimester: Secondary | ICD-10-CM | POA: Diagnosis not present

## 2017-05-30 DIAGNOSIS — Z79899 Other long term (current) drug therapy: Secondary | ICD-10-CM | POA: Diagnosis not present

## 2017-05-30 DIAGNOSIS — O2342 Unspecified infection of urinary tract in pregnancy, second trimester: Principal | ICD-10-CM | POA: Insufficient documentation

## 2017-05-30 DIAGNOSIS — Z882 Allergy status to sulfonamides status: Secondary | ICD-10-CM | POA: Insufficient documentation

## 2017-05-30 DIAGNOSIS — O26892 Other specified pregnancy related conditions, second trimester: Secondary | ICD-10-CM | POA: Diagnosis present

## 2017-05-30 DIAGNOSIS — N390 Urinary tract infection, site not specified: Secondary | ICD-10-CM | POA: Diagnosis present

## 2017-05-30 DIAGNOSIS — O219 Vomiting of pregnancy, unspecified: Secondary | ICD-10-CM

## 2017-05-30 DIAGNOSIS — Z6791 Unspecified blood type, Rh negative: Secondary | ICD-10-CM

## 2017-05-30 DIAGNOSIS — O09891 Supervision of other high risk pregnancies, first trimester: Secondary | ICD-10-CM

## 2017-05-30 LAB — WET PREP, GENITAL
CLUE CELLS WET PREP: NONE SEEN
Sperm: NONE SEEN
TRICH WET PREP: NONE SEEN
Yeast Wet Prep HPF POC: NONE SEEN

## 2017-05-30 LAB — URINALYSIS, COMPLETE (UACMP) WITH MICROSCOPIC
Bilirubin Urine: NEGATIVE
GLUCOSE, UA: NEGATIVE mg/dL
Hgb urine dipstick: NEGATIVE
KETONES UR: NEGATIVE mg/dL
NITRITE: NEGATIVE
PH: 7 (ref 5.0–8.0)
Protein, ur: NEGATIVE mg/dL
SPECIFIC GRAVITY, URINE: 1.009 (ref 1.005–1.030)

## 2017-05-30 MED ORDER — ACETAMINOPHEN 325 MG PO TABS: 650.0000 mg | ORAL_TABLET | ORAL | Status: DC | PRN

## 2017-05-30 MED ORDER — NITROFURANTOIN MONOHYD MACRO 100 MG PO CAPS: 100.0000 mg | ORAL_CAPSULE | Freq: Two times a day (BID) | ORAL | 0 refills | Status: AC

## 2017-05-30 MED ORDER — PHENAZOPYRIDINE HCL 100 MG PO TABS: 100.0000 mg | ORAL_TABLET | Freq: Three times a day (TID) | ORAL | 0 refills | Status: DC | PRN

## 2017-05-30 NOTE — Discharge Summary (Signed)
See Final Progress Note 05/30/2017.  Marcelyn Bruins, CNM 05/30/2017  9:58 AM

## 2017-05-30 NOTE — OB Triage Note (Signed)
22y/o G1P0 [redacted]w[redacted]d presents to BP d/t cramping and "gush of fluid" at 0600 this morning. Nitrazine negative. Denies bleeding, reports pos fetal movement.

## 2017-05-30 NOTE — Final Progress Note (Signed)
Physician Final Progress Note  Patient ID: Christine Arroyo MRN: [id number] DOB/AGE: [birth year] 22 y.o.  Admit date: 05/30/2017 Admitting provider: Vena Austria, MD Discharge date: 05/30/2017   Admission Diagnoses: Possible ROM, abdominal pain  Discharge Diagnoses:  Active Problems:   Urinary tract infection   History of Present Illness: The patient is a 22 y.o. female G1P0000 at [redacted]w[redacted]d who presents for awakening at 0600 with a "gush of fluid" which she describes as clear and odorless. Also having some cramping pains/pressure in her suprapubic area.  Endorses good fetal movement. Denies vaginal bleeding. No CVAT.  Review of systems negative unless otherwise noted in HPI.  Past Medical History:  Diagnosis Date  . Migraines   . Numbness and tingling of both legs   . PCOS (polycystic ovarian syndrome)   . Poor circulation of extremity     Past Surgical History:  Procedure Laterality Date  . APPENDECTOMY  2011    No current facility-administered medications on file prior to encounter.    Current Outpatient Medications on File Prior to Encounter  Medication Sig Dispense Refill  . promethazine (PHENERGAN) 25 MG tablet Take 1 tablet (25 mg total) by mouth every 6 (six) hours as needed for nausea or vomiting. 30 tablet 2  . pyridOXINE (VITAMIN B-6) 25 MG tablet Take 1 tablet (25 mg total) by mouth 4 (four) times daily as needed (nausea and vomiting). 30 tablet 3    Allergies  Allergen Reactions  . Sulfa Antibiotics Hives  . Amoxicillin Hives  . Azithromycin Hives  . Keflex [Cephalexin] Hives  . Morphine And Related Hives  . Penicillins Hives    Has patient had a PCN reaction causing immediate rash, facial/tongue/throat swelling, SOB or lightheadedness with hypotension: Yes Has patient had a PCN reaction causing severe rash involving mucus membranes or skin necrosis: No Has patient had a PCN reaction that required hospitalization No Has patient had a PCN reaction occurring  within the last 10 years: No If all of the above answers are "NO", then may proceed with Cephalosporin use.  . Robitussin (Alcohol Free) [Guaifenesin] Hives    Social History   Socioeconomic History  . Marital status: Married    Spouse name: Not on file  . Number of children: Not on file  . Years of education: Not on file  . Highest education level: Not on file  Occupational History  . Not on file  Social Needs  . Financial resource strain: Not on file  . Food insecurity:    Worry: Not on file    Inability: Not on file  . Transportation needs:    Medical: Not on file    Non-medical: Not on file  Tobacco Use  . Smoking status: Never Smoker  . Smokeless tobacco: Never Used  Substance and Sexual Activity  . Alcohol use: No  . Drug use: No  . Sexual activity: Yes    Birth control/protection: None  Lifestyle  . Physical activity:    Days per week: Not on file    Minutes per session: Not on file  . Stress: Not on file  Relationships  . Social connections:    Talks on phone: Not on file    Gets together: Not on file    Attends religious service: Not on file    Active member of club or organization: Not on file    Attends meetings of clubs or organizations: Not on file    Relationship status: Not on file  . Intimate partner violence:  Fear of current or ex partner: Not on file    Emotionally abused: Not on file    Physically abused: Not on file    Forced sexual activity: Not on file  Other Topics Concern  . Not on file  Social History Narrative  . Not on file    Physical Exam: BP 115/70 (BP Location: Left Arm)   Pulse 75   Temp 98.4 F (36.9 C) (Oral)   Resp 16   LMP 11/15/2016 (Exact Date)   Gen: NAD CV: regular rate and rhythm Pulm: no increased work of breathing Pelvic: SSE. Cervix visually closed, no pooling, small amount of white discharge Ext: no signs of DVT  EFM: Baseline 145, moderate variability, accelerations present, decelerations absent, toco  shows uterine irritability. Category I.  Consults: None  Significant Findings/ Diagnostic Studies: labs: trace leukocytes and some WBC on urinalysis. No ferning on microscopy. ROM ruled out. Wet prep negative. Urine culture pending.  Procedures: SSE  Discharge Condition: good  Disposition: Discharge disposition: 01-Home or Self Care       Diet: Regular diet  Discharge Activity: Activity as tolerated  Discharge Instructions    Discharge activity:  No Restrictions   Complete by:  As directed    Discharge diet:  No restrictions   Complete by:  As directed    No sexual activity restrictions   Complete by:  As directed    Notify physician for a general feeling that "something is not right"   Complete by:  As directed    Notify physician for increase or change in vaginal discharge   Complete by:  As directed    Notify physician for intestinal cramps, with or without diarrhea, sometimes described as "gas pain"   Complete by:  As directed    Notify physician for leaking of fluid   Complete by:  As directed    Notify physician for low, dull backache, unrelieved by heat or Tylenol   Complete by:  As directed    Notify physician for menstrual like cramps   Complete by:  As directed    Notify physician for pelvic pressure   Complete by:  As directed    Notify physician for uterine contractions.  These may be painless and feel like the uterus is tightening or the baby is  "balling up"   Complete by:  As directed    Notify physician for vaginal bleeding   Complete by:  As directed    PRETERM LABOR:  Includes any of the follwing symptoms that occur between 20 - [redacted] weeks gestation.  If these symptoms are not stopped, preterm labor can result in preterm delivery, placing your baby at risk   Complete by:  As directed      Allergies as of 05/30/2017      Reactions   Sulfa Antibiotics Hives   Amoxicillin Hives   Azithromycin Hives   Keflex [cephalexin] Hives   Morphine And Related  Hives   Penicillins Hives   Has patient had a PCN reaction causing immediate rash, facial/tongue/throat swelling, SOB or lightheadedness with hypotension: Yes Has patient had a PCN reaction causing severe rash involving mucus membranes or skin necrosis: No Has patient had a PCN reaction that required hospitalization No Has patient had a PCN reaction occurring within the last 10 years: No If all of the above answers are "NO", then may proceed with Cephalosporin use.   Robitussin (alcohol Free) [guaifenesin] Hives      Medication List  TAKE these medications   nitrofurantoin (macrocrystal-monohydrate) 100 MG capsule Commonly known as:  MACROBID Take 1 capsule (100 mg total) by mouth 2 (two) times daily for 5 days.   phenazopyridine 100 MG tablet Commonly known as:  PYRIDIUM Take 1 tablet (100 mg total) by mouth 3 (three) times daily as needed for pain.   promethazine 25 MG tablet Commonly known as:  PHENERGAN Take 1 tablet (25 mg total) by mouth every 6 (six) hours as needed for nausea or vomiting.   pyridOXINE 25 MG tablet Commonly known as:  VITAMIN B-6 Take 1 tablet (25 mg total) by mouth 4 (four) times daily as needed (nausea and vomiting).      Empiric antibiotic treatment based on symptoms and UA findings for UTI. Will notify if urine culture results necessitate change in therapy. Advised good hydration. Patient to return to care if symptoms worsen or with signs of preterm labor.  Signed: Oswaldo Conroy, CNM  05/30/2017, 9:55 AM

## 2017-05-31 ENCOUNTER — Encounter: Payer: Self-pay | Admitting: Maternal Newborn

## 2017-05-31 LAB — URINE CULTURE

## 2017-06-01 ENCOUNTER — Ambulatory Visit: Payer: BLUE CROSS/BLUE SHIELD

## 2017-06-01 DIAGNOSIS — R1030 Lower abdominal pain, unspecified: Secondary | ICD-10-CM

## 2017-06-01 NOTE — Telephone Encounter (Signed)
Pt called triage line this morning stating that rx for UTI she was given doesn't seem to be working. Pt c/o still having small contractions on and off and feeling miserable. Please advise. Pt cb# [phone] thank you.

## 2017-06-03 ENCOUNTER — Ambulatory Visit (INDEPENDENT_AMBULATORY_CARE_PROVIDER_SITE_OTHER): Payer: BLUE CROSS/BLUE SHIELD | Admitting: Certified Nurse Midwife

## 2017-06-03 ENCOUNTER — Other Ambulatory Visit: Payer: BLUE CROSS/BLUE SHIELD

## 2017-06-03 VITALS — BP 102/58 | Wt 136.0 lb

## 2017-06-03 DIAGNOSIS — O360120 Maternal care for anti-D [Rh] antibodies, second trimester, not applicable or unspecified: Secondary | ICD-10-CM

## 2017-06-03 DIAGNOSIS — Z3400 Encounter for supervision of normal first pregnancy, unspecified trimester: Secondary | ICD-10-CM

## 2017-06-03 DIAGNOSIS — Z6791 Unspecified blood type, Rh negative: Secondary | ICD-10-CM

## 2017-06-03 DIAGNOSIS — Z3A27 27 weeks gestation of pregnancy: Secondary | ICD-10-CM | POA: Diagnosis not present

## 2017-06-03 DIAGNOSIS — O26899 Other specified pregnancy related conditions, unspecified trimester: Secondary | ICD-10-CM

## 2017-06-03 LAB — URINE CULTURE

## 2017-06-03 MED ORDER — RHO D IMMUNE GLOBULIN 1500 UNIT/2ML IJ SOSY
300.0000 ug | PREFILLED_SYRINGE | Freq: Once | INTRAMUSCULAR | Status: AC
  Administered 2017-06-03: 300 ug via INTRAMUSCULAR

## 2017-06-03 NOTE — Progress Notes (Signed)
Pt states she is still having small ctx the same as when she went to L&D on 05/30/17. Still taking abx, repeated culture on 06/01/17 (negative). 28 wk labs today.

## 2017-06-04 LAB — 28 WEEKS RH-PANEL
Antibody Screen: NEGATIVE
BASOS ABS: 0 10*3/uL (ref 0.0–0.2)
Basos: 0 %
EOS (ABSOLUTE): 0.1 10*3/uL (ref 0.0–0.4)
Eos: 1 %
GESTATIONAL DIABETES SCREEN: 140 mg/dL — AB (ref 65–139)
HEMATOCRIT: 34.8 % (ref 34.0–46.6)
HEMOGLOBIN: 11.4 g/dL (ref 11.1–15.9)
HIV Screen 4th Generation wRfx: NONREACTIVE
Immature Grans (Abs): 0 10*3/uL (ref 0.0–0.1)
Immature Granulocytes: 0 %
LYMPHS ABS: 1.5 10*3/uL (ref 0.7–3.1)
Lymphs: 12 %
MCH: 31 pg (ref 26.6–33.0)
MCHC: 32.8 g/dL (ref 31.5–35.7)
MCV: 95 fL (ref 79–97)
MONOS ABS: 0.7 10*3/uL (ref 0.1–0.9)
Monocytes: 6 %
NEUTROS PCT: 81 %
Neutrophils Absolute: 10.4 10*3/uL — ABNORMAL HIGH (ref 1.4–7.0)
PLATELETS: 254 10*3/uL (ref 150–379)
RBC: 3.68 x10E6/uL — ABNORMAL LOW (ref 3.77–5.28)
RDW: 13.5 % (ref 12.3–15.4)
RPR Ser Ql: NONREACTIVE
WBC: 12.8 10*3/uL — ABNORMAL HIGH (ref 3.4–10.8)

## 2017-06-05 NOTE — Progress Notes (Signed)
HROB at27wk 6days: Continues to have irregular, mild contractions since her L&D visit 5/6.Was started on Keflex for a possible UTI. No vaginal bleeding. Good FM. Encouraged to stay well hydrated. Cervix remains closed today/ soft/long and baby OOP. 28 week labs today. Rhogam given (o neg) Will be breast feeding. Farrel Conners, CNM

## 2017-06-07 ENCOUNTER — Other Ambulatory Visit: Payer: Self-pay | Admitting: Maternal Newborn

## 2017-06-07 DIAGNOSIS — R7309 Other abnormal glucose: Secondary | ICD-10-CM

## 2017-06-07 NOTE — Progress Notes (Signed)
Patient aware to schedule 3 hour GTT.  Marcelyn Bruins, CNM 06/07/2017  12:22 PM

## 2017-06-16 ENCOUNTER — Ambulatory Visit (INDEPENDENT_AMBULATORY_CARE_PROVIDER_SITE_OTHER): Payer: BLUE CROSS/BLUE SHIELD | Admitting: Maternal Newborn

## 2017-06-16 ENCOUNTER — Encounter: Payer: Self-pay | Admitting: Maternal Newborn

## 2017-06-16 VITALS — BP 110/68 | Wt 143.0 lb

## 2017-06-16 DIAGNOSIS — Z3A30 30 weeks gestation of pregnancy: Secondary | ICD-10-CM

## 2017-06-16 DIAGNOSIS — Z3400 Encounter for supervision of normal first pregnancy, unspecified trimester: Secondary | ICD-10-CM

## 2017-06-16 DIAGNOSIS — Z23 Encounter for immunization: Secondary | ICD-10-CM | POA: Diagnosis not present

## 2017-06-16 NOTE — Progress Notes (Signed)
ROB TDAP today  No contractions, no concerns, no lof

## 2017-06-16 NOTE — Addendum Note (Signed)
Addended by: Desmond Dike on: 06/16/2017 03:11 PM   Modules accepted: Orders

## 2017-06-16 NOTE — Patient Instructions (Signed)
Third Trimester of Pregnancy The third trimester is from week 28 through week 40 (months 7 through 9). The third trimester is a time when the unborn baby (fetus) is growing rapidly. At the end of the ninth month, the fetus is about 20 inches in length and weighs 6-10 pounds. Body changes during your third trimester Your body will continue to go through many changes during pregnancy. The changes vary from woman to woman. During the third trimester:  Your weight will continue to increase. You can expect to gain 25-35 pounds (11-16 kg) by the end of the pregnancy.  You may begin to get stretch marks on your hips, abdomen, and breasts.  You may urinate more often because the fetus is moving lower into your pelvis and pressing on your bladder.  You may develop or continue to have heartburn. This is caused by increased hormones that slow down muscles in the digestive tract.  You may develop or continue to have constipation because increased hormones slow digestion and cause the muscles that push waste through your intestines to relax.  You may develop hemorrhoids. These are swollen veins (varicose veins) in the rectum that can itch or be painful.  You may develop swollen, bulging veins (varicose veins) in your legs.  You may have increased body aches in the pelvis, back, or thighs. This is due to weight gain and increased hormones that are relaxing your joints.  You may have changes in your hair. These can include thickening of your hair, rapid growth, and changes in texture. Some women also have hair loss during or after pregnancy, or hair that feels dry or thin. Your hair will most likely return to normal after your baby is born.  Your breasts will continue to grow and they will continue to become tender. A yellow fluid (colostrum) may leak from your breasts. This is the first milk you are producing for your baby.  Your belly button may stick out.  You may notice more swelling in your hands,  face, or ankles.  You may have increased tingling or numbness in your hands, arms, and legs. The skin on your belly may also feel numb.  You may feel short of breath because of your expanding uterus.  You may have more problems sleeping. This can be caused by the size of your belly, increased need to urinate, and an increase in your body's metabolism.  You may notice the fetus "dropping," or moving lower in your abdomen (lightening).  You may have increased vaginal discharge.  You may notice your joints feel loose and you may have pain around your pelvic bone.  What to expect at prenatal visits You will have prenatal exams every 2 weeks until week 36. Then you will have weekly prenatal exams. During a routine prenatal visit:  You will be weighed to make sure you and the baby are growing normally.  Your blood pressure will be taken.  Your abdomen will be measured to track your baby's growth.  The fetal heartbeat will be listened to.  Any test results from the previous visit will be discussed.  You may have a cervical check near your due date to see if your cervix has softened or thinned (effaced).  You will be tested for Group B streptococcus. This happens between 35 and 37 weeks.  Your health care provider may ask you:  What your birth plan is.  How you are feeling.  If you are feeling the baby move.  If you have had   any abnormal symptoms, such as leaking fluid, bleeding, severe headaches, or abdominal cramping.  If you are using any tobacco products, including cigarettes, chewing tobacco, and electronic cigarettes.  If you have any questions.  Other tests or screenings that may be performed during your third trimester include:  Blood tests that check for low iron levels (anemia).  Fetal testing to check the health, activity level, and growth of the fetus. Testing is done if you have certain medical conditions or if there are problems during the  pregnancy.  Nonstress test (NST). This test checks the health of your baby to make sure there are no signs of problems, such as the baby not getting enough oxygen. During this test, a belt is placed around your belly. The baby is made to move, and its heart rate is monitored during movement.  What is false labor? False labor is a condition in which you feel small, irregular tightenings of the muscles in the womb (contractions) that usually go away with rest, changing position, or drinking water. These are called Braxton Hicks contractions. Contractions may last for hours, days, or even weeks before true labor sets in. If contractions come at regular intervals, become more frequent, increase in intensity, or become painful, you should see your health care provider. What are the signs of labor?  Abdominal cramps.  Regular contractions that start at 10 minutes apart and become stronger and more frequent with time.  Contractions that start on the top of the uterus and spread down to the lower abdomen and back.  Increased pelvic pressure and dull back pain.  A watery or bloody mucus discharge that comes from the vagina.  Leaking of amniotic fluid. This is also known as your "water breaking." It could be a slow trickle or a gush. Let your health care provider know if it has a color or strange odor. If you have any of these signs, call your health care provider right away, even if it is before your due date. Follow these instructions at home: Medicines  Follow your health care provider's instructions regarding medicine use. Specific medicines may be either safe or unsafe to take during pregnancy.  Take a prenatal vitamin that contains at least 600 micrograms (mcg) of folic acid.  If you develop constipation, try taking a stool softener if your health care provider approves. Eating and drinking  Eat a balanced diet that includes fresh fruits and vegetables, whole grains, good sources of protein  such as meat, eggs, or tofu, and low-fat dairy. Your health care provider will help you determine the amount of weight gain that is right for you.  Avoid raw meat and uncooked cheese. These carry germs that can cause birth defects in the baby.  If you have low calcium intake from food, talk to your health care provider about whether you should take a daily calcium supplement.  Eat four or five small meals rather than three large meals a day.  Limit foods that are high in fat and processed sugars, such as fried and sweet foods.  To prevent constipation: ? Drink enough fluid to keep your urine clear or pale yellow. ? Eat foods that are high in fiber, such as fresh fruits and vegetables, whole grains, and beans. Activity  Exercise only as directed by your health care provider. Most women can continue their usual exercise routine during pregnancy. Try to exercise for 30 minutes at least 5 days a week. Stop exercising if you experience uterine contractions.  Avoid heavy   lifting.  Do not exercise in extreme heat or humidity, or at high altitudes.  Wear low-heel, comfortable shoes.  Practice good posture.  You may continue to have sex unless your health care provider tells you otherwise. Relieving pain and discomfort  Take frequent breaks and rest with your legs elevated if you have leg cramps or low back pain.  Take warm sitz baths to soothe any pain or discomfort caused by hemorrhoids. Use hemorrhoid cream if your health care provider approves.  Wear a good support bra to prevent discomfort from breast tenderness.  If you develop varicose veins: ? Wear support pantyhose or compression stockings as told by your healthcare provider. ? Elevate your feet for 15 minutes, 3-4 times a day. Prenatal care  Write down your questions. Take them to your prenatal visits.  Keep all your prenatal visits as told by your health care provider. This is important. Safety  Wear your seat belt at  all times when driving.  Make a list of emergency phone numbers, including numbers for family, friends, the hospital, and police and fire departments. General instructions  Avoid cat litter boxes and soil used by cats. These carry germs that can cause birth defects in the baby. If you have a cat, ask someone to clean the litter box for you.  Do not travel far distances unless it is absolutely necessary and only with the approval of your health care provider.  Do not use hot tubs, steam rooms, or saunas.  Do not drink alcohol.  Do not use any products that contain nicotine or tobacco, such as cigarettes and e-cigarettes. If you need help quitting, ask your health care provider.  Do not use any medicinal herbs or unprescribed drugs. These chemicals affect the formation and growth of the baby.  Do not douche or use tampons or scented sanitary pads.  Do not cross your legs for long periods of time.  To prepare for the arrival of your baby: ? Take prenatal classes to understand, practice, and ask questions about labor and delivery. ? Make a trial run to the hospital. ? Visit the hospital and tour the maternity area. ? Arrange for maternity or paternity leave through employers. ? Arrange for family and friends to take care of pets while you are in the hospital. ? Purchase a rear-facing car seat and make sure you know how to install it in your car. ? Pack your hospital bag. ? Prepare the baby's nursery. Make sure to remove all pillows and stuffed animals from the baby's crib to prevent suffocation.  Visit your dentist if you have not gone during your pregnancy. Use a soft toothbrush to brush your teeth and be gentle when you floss. Contact a health care provider if:  You are unsure if you are in labor or if your water has broken.  You become dizzy.  You have mild pelvic cramps, pelvic pressure, or nagging pain in your abdominal area.  You have lower back pain.  You have persistent  nausea, vomiting, or diarrhea.  You have an unusual or bad smelling vaginal discharge.  You have pain when you urinate. Get help right away if:  Your water breaks before 37 weeks.  You have regular contractions less than 5 minutes apart before 37 weeks.  You have a fever.  You are leaking fluid from your vagina.  You have spotting or bleeding from your vagina.  You have severe abdominal pain or cramping.  You have rapid weight loss or weight gain.    You have shortness of breath with chest pain.  You notice sudden or extreme swelling of your face, hands, ankles, feet, or legs.  Your baby makes fewer than 10 movements in 2 hours.  You have severe headaches that do not go away when you take medicine.  You have vision changes. Summary  The third trimester is from week 28 through week 40, months 7 through 9. The third trimester is a time when the unborn baby (fetus) is growing rapidly.  During the third trimester, your discomfort may increase as you and your baby continue to gain weight. You may have abdominal, leg, and back pain, sleeping problems, and an increased need to urinate.  During the third trimester your breasts will keep growing and they will continue to become tender. A yellow fluid (colostrum) may leak from your breasts. This is the first milk you are producing for your baby.  False labor is a condition in which you feel small, irregular tightenings of the muscles in the womb (contractions) that eventually go away. These are called Braxton Hicks contractions. Contractions may last for hours, days, or even weeks before true labor sets in.  Signs of labor can include: abdominal cramps; regular contractions that start at 10 minutes apart and become stronger and more frequent with time; watery or bloody mucus discharge that comes from the vagina; increased pelvic pressure and dull back pain; and leaking of amniotic fluid. This information is not intended to replace advice  given to you by your health care provider. Make sure you discuss any questions you have with your health care provider. Document Released: 01/05/2001 Document Revised: 06/19/2015 Document Reviewed: 03/14/2012 Elsevier Interactive Patient Education  2017 [employer]  

## 2017-06-16 NOTE — Progress Notes (Signed)
Routine Prenatal Care Visit  Subjective  Christine Arroyo is a 22 y.o. G1P0000 at [redacted]w[redacted]d being seen today for ongoing prenatal care.  She is currently monitored for the following issues for this low-risk pregnancy and has Polyclonal gammopathy; Supervision of normal first pregnancy, antepartum; Rh negative state in antepartum period, first trimester; Nausea and vomiting in pregnancy prior to [redacted] weeks gestation; Susceptible to Varicella (non-immune), currently pregnant in first trimester; and Urinary tract infection on their problem list.  ----------------------------------------------------------------------------------- Patient reports no complaints.   Contractions: Not present. Vag. Bleeding: None.  Movement: Present. No leaking of fluid.  ----------------------------------------------------------------------------------- The following portions of the patient's history were reviewed and updated as appropriate: allergies, current medications, past family history, past medical history, past social history, past surgical history and problem list. Problem list updated.   Objective  Blood pressure 110/68, weight 143 lb (64.9 kg), last menstrual period 11/15/2016. Pregravid weight 126 lb (57.2 kg) Total Weight Gain 17 lb (7.711 kg) Urinalysis: Urine Protein: Negative Urine Glucose: Negative  Fetal Status: Fetal Heart Rate (bpm): 152 Fundal Height: 31 cm Movement: Present     General:  Alert, oriented and cooperative. Patient is in no acute distress.  Skin: Skin is warm and dry. Small amount of healing rash on lower abdomen.   Cardiovascular: Normal heart rate noted  Respiratory: Normal respiratory effort, no problems with respiration noted  Abdomen: Soft, gravid, appropriate for gestational age. Pain/Pressure: Absent     Pelvic:  Cervical exam deferred        Extremities: Normal range of motion.  Edema: None  Mental Status: Normal mood and affect. Normal behavior. Normal judgment and thought  content.     Assessment   22 y.o. G1P0000 at [redacted]w[redacted]d, EDD 08/27/2017 by Ultrasound presenting for routine prenatal visit.  Plan   pregnancy #1 Problems (from 11/15/16 to present)    Problem Noted Resolved   Rh negative state in antepartum period, first trimester 01/20/2017 by Natale Milch, MD No   Nausea and vomiting in pregnancy prior to [redacted] weeks gestation 01/20/2017 by Natale Milch, MD No   Susceptible to Varicella (non-immune), currently pregnant in first trimester 01/20/2017 by Natale Milch, MD No   Supervision of normal first pregnancy, antepartum 01/17/2017 by Natale Milch, MD No   Overview Addendum 06/05/2017  8:53 PM by Farrel Conners, CNM      [facility] Prenatal Labs  Dating  Blood type: O/Negative/-- (12/24 0910)   Genetic Screen Declines   Antibody:Negative (12/24 0910)  Anatomic [place] Complete 04/08/2017 Rubella: 2.61 (12/24 0910)  Varicella: Nonimmune  GTT Early:        28 wk:      RPR: Non Reactive (12/24 0910)   Rhogam  5/10 HBsAg: Negative (12/24 0910)   TDaP vaccine                       HIV: Non Reactive (12/24 0910)   Flu Shot                                GBS:   Contraception  Pap: 07/25/2016  CBB     CS/VBAC NA   Baby Food Breast   Support Person  Husband Vincenza Hews            TDaP today.  Preterm labor symptoms and general obstetric precautions including but not limited to vaginal bleeding, contractions, leaking of  fluid and fetal movement were reviewed.  Please refer to After Visit Summary for other counseling recommendations.   Return in about 2 weeks (around 06/30/2017) for ROB.  Marcelyn Bruins, CNM 06/16/2017  1:46 PM

## 2017-06-21 ENCOUNTER — Other Ambulatory Visit: Payer: BLUE CROSS/BLUE SHIELD

## 2017-06-21 DIAGNOSIS — R7309 Other abnormal glucose: Secondary | ICD-10-CM

## 2017-06-22 LAB — GESTATIONAL GLUCOSE TOLERANCE
GLUCOSE 1 HOUR GTT: 187 mg/dL — AB (ref 65–179)
GLUCOSE FASTING: 75 mg/dL (ref 65–94)
Glucose, GTT - 2 Hour: 185 mg/dL — ABNORMAL HIGH (ref 65–154)
Glucose, GTT - 3 Hour: 148 mg/dL — ABNORMAL HIGH (ref 65–139)

## 2017-07-01 ENCOUNTER — Ambulatory Visit (INDEPENDENT_AMBULATORY_CARE_PROVIDER_SITE_OTHER): Payer: BLUE CROSS/BLUE SHIELD | Admitting: Maternal Newborn

## 2017-07-01 ENCOUNTER — Telehealth: Payer: Self-pay

## 2017-07-01 VITALS — BP 110/70 | Wt 142.0 lb

## 2017-07-01 DIAGNOSIS — N898 Other specified noninflammatory disorders of vagina: Secondary | ICD-10-CM

## 2017-07-01 DIAGNOSIS — R1030 Lower abdominal pain, unspecified: Secondary | ICD-10-CM

## 2017-07-01 DIAGNOSIS — O26893 Other specified pregnancy related conditions, third trimester: Secondary | ICD-10-CM

## 2017-07-01 DIAGNOSIS — O24419 Gestational diabetes mellitus in pregnancy, unspecified control: Secondary | ICD-10-CM

## 2017-07-01 DIAGNOSIS — O0993 Supervision of high risk pregnancy, unspecified, third trimester: Secondary | ICD-10-CM

## 2017-07-01 DIAGNOSIS — Z3A31 31 weeks gestation of pregnancy: Secondary | ICD-10-CM

## 2017-07-01 MED ORDER — ACCU-CHEK FASTCLIX LANCETS MISC: 1.0000 [IU] | Freq: Four times a day (QID) | 12 refills | Status: DC

## 2017-07-01 MED ORDER — ACCU-CHEK NANO SMARTVIEW W/DEVICE KIT: 1.0000 | PACK | 0 refills | Status: DC

## 2017-07-01 MED ORDER — GLUCOSE BLOOD VI STRP: ORAL_STRIP | 12 refills | Status: DC

## 2017-07-01 NOTE — Progress Notes (Signed)
C/o cramping more than usual; inc d/c.

## 2017-07-01 NOTE — Progress Notes (Signed)
Routine Prenatal Care Visit  Subjective  Christine Arroyo is a 22 y.o. G1P0000 at 5762w6d being seen today for ongoing prenatal care.  She is currently monitored for the following issues for this low-risk pregnancy and has Polyclonal gammopathy; Supervision of high risk pregnancy, antepartum, third trimester; Rh negative state in antepartum period, first trimester; Nausea and vomiting in pregnancy prior to [redacted] weeks gestation; and Susceptible to Varicella (non-immune), currently pregnant in first trimester on their problem list.  ----------------------------------------------------------------------------------- Patient reports some cramping and vaginal discharge.   Contractions: Not present. Vag. Bleeding: None.  Movement: Present. No leaking of fluid.  ----------------------------------------------------------------------------------- The following portions of the patient's history were reviewed and updated as appropriate: allergies, current medications, past family history, past medical history, past social history, past surgical history and problem list. Problem list updated.   Objective  Last menstrual period 11/15/2016. Pregravid weight 126 lb (57.2 kg) Total Weight Gain 16 lb (7.258 kg) Urinalysis: Urine Protein: Negative Urine Glucose: Negative  Fetal Status: Fetal Heart Rate (bpm): 148 Fundal Height: 33 cm Movement: Present     General:  Alert, oriented and cooperative. Patient is in no acute distress.  Skin: Skin is warm and dry. No rash noted.   Cardiovascular: Normal heart rate noted  Respiratory: Normal respiratory effort, no problems with respiration noted  Abdomen: Soft, gravid, appropriate for gestational age. Pain/Pressure: Present     Pelvic:  Cervical exam deferred        Extremities: Normal range of motion.  Edema: None  Mental Status: Normal mood and affect. Normal behavior. Normal judgment and thought content.     Assessment   22 y.o. G1P0000 at 5962w6d, EDD  08/27/2017 by Ultrasound presenting for routine prenatal visit.  Plan   pregnancy #1 Problems (from 11/15/16 to present)    Problem Noted Resolved   Rh negative state in antepartum period, first trimester 01/20/2017 by Natale MilchSchuman, Christanna R, MD No   Nausea and vomiting in pregnancy prior to [redacted] weeks gestation 01/20/2017 by Natale MilchSchuman, Christanna R, MD No   Susceptible to Varicella (non-immune), currently pregnant in first trimester 01/20/2017 by Natale MilchSchuman, Christanna R, MD No   Supervision of normal first pregnancy, antepartum 01/17/2017 by Natale MilchSchuman, Christanna R, MD No   Overview Addendum 06/05/2017  8:53 PM by Farrel ConnersGutierrez, Colleen, CNM      [facility] Prenatal Labs  Dating  Blood type: O/Negative/-- (12/24 0910)   Genetic Screen Declines   Antibody:Negative (12/24 0910)  Anatomic US Complete 04/08/2017 Rubella: 2.61 (12/24 0910)  Varicella: Nonimmune  GTT Early:        28 wk:      RPR: Non Reactive (12/24 0910)   Rhogam  5/10 HBsAg: Negative (12/24 0910)   TDaP vaccine                       HIV: Non Reactive (12/24 0910)   Flu Shot                                GBS:   Contraception  Pap: 07/25/2016  CBB     CS/VBAC NA   Baby Food Breast   Support Person  Husband Shane            NuSwab sent for increase in vaginal discharge and return of cramping pains.  Discussed that 3 hour GTT had 3 abnormal values; gestational diabetes diagnosis, and referral to [employer] for teaching  about dietary control of blood glucose and how to self-monitor blood glucose and log results for review at her visits.   Rx sent for blood glucose monitor, test strips, and lancets.  Return in about 2 weeks (around 07/15/2017) for ROB.  Marcelyn Bruins, CNM 07/01/2017  10:33 AM

## 2017-07-01 NOTE — Telephone Encounter (Signed)
Called pt and apologized that I had not done a urinalysis and threw urine out after testing for the usual glucose and protein.  I had done other tasks between receiving her specimen and testing it and forgot to do the extra testing.  Pt was very understanding saying she felt better after talking c JS and would see how aptima cx came out and see how her sxs were.  JS aware.

## 2017-07-02 ENCOUNTER — Encounter: Payer: Self-pay | Admitting: Maternal Newborn

## 2017-07-03 ENCOUNTER — Encounter: Payer: Self-pay | Admitting: Maternal Newborn

## 2017-07-04 ENCOUNTER — Other Ambulatory Visit: Payer: Self-pay | Admitting: Maternal Newborn

## 2017-07-04 DIAGNOSIS — O24410 Gestational diabetes mellitus in pregnancy, diet controlled: Secondary | ICD-10-CM

## 2017-07-04 MED ORDER — RELION PREMIER COMPACT SYSTEM W/DEVICE KIT: 1.0000 | PACK | Freq: Four times a day (QID) | 0 refills | Status: DC

## 2017-07-04 MED ORDER — GLUCOSE BLOOD VI STRP: ORAL_STRIP | 12 refills | Status: DC

## 2017-07-04 MED ORDER — RELION ULTRA THIN LANCETS 30G MISC: 1.0000 [IU] | Freq: Four times a day (QID) | 12 refills | Status: DC

## 2017-07-04 NOTE — Progress Notes (Signed)
Per pharmacy recommendation, changed brand of glucose test kit and supplies to less costly alternative.

## 2017-07-05 ENCOUNTER — Encounter: Payer: BLUE CROSS/BLUE SHIELD | Attending: Maternal Newborn | Admitting: *Deleted

## 2017-07-05 ENCOUNTER — Encounter: Payer: Self-pay | Admitting: *Deleted

## 2017-07-05 VITALS — BP 98/60 | Ht 62.0 in | Wt 139.7 lb

## 2017-07-05 DIAGNOSIS — O24419 Gestational diabetes mellitus in pregnancy, unspecified control: Secondary | ICD-10-CM | POA: Diagnosis not present

## 2017-07-05 DIAGNOSIS — Z3A00 Weeks of gestation of pregnancy not specified: Secondary | ICD-10-CM | POA: Diagnosis not present

## 2017-07-05 DIAGNOSIS — Z713 Dietary counseling and surveillance: Secondary | ICD-10-CM | POA: Insufficient documentation

## 2017-07-05 DIAGNOSIS — O24410 Gestational diabetes mellitus in pregnancy, diet controlled: Secondary | ICD-10-CM

## 2017-07-05 LAB — NUSWAB VAGINITIS PLUS (VG+)
Candida albicans, NAA: NEGATIVE
Candida glabrata, NAA: NEGATIVE
Chlamydia trachomatis, NAA: NEGATIVE
Neisseria gonorrhoeae, NAA: NEGATIVE
TRICH VAG BY NAA: NEGATIVE

## 2017-07-05 NOTE — Patient Instructions (Signed)
Read booklet on Gestational Diabetes Follow Gestational Meal Planning Guidelines Limit desserts and sweets Avoid sugar sweetened drinks (tea, juice) Complete a 3 Day Food Record and bring to next appointment Check blood sugars 4 x day - before breakfast and 2 hrs after every meal and record  Bring blood sugar log to all appointments Purchase urine ketone strips if blood sugars not controlled and check urine ketones every am:  If + increase bedtime snack to 1 protein and 2 carbohydrate servings Walk 20-30 minutes at least 5 x week if permitted by MD

## 2017-07-05 NOTE — Progress Notes (Signed)
Diabetes Self-Management Education  Visit Type: First/Initial  Appt. Start Time: 1025 Appt. End Time: 1155  07/05/2017  [name], identified by name and date of birth, is a 22 y.o. female with a diagnosis of Diabetes: Gestational Diabetes.   ASSESSMENT  Blood pressure 98/60, height 5\' 2"  (1.575 m), weight 139 lb 11.2 oz (63.4 kg), last menstrual period 11/15/2016. Body mass index is 25.55 kg/m.  Diabetes Self-Management Education - 07/05/17 1707      Visit Information   Visit Type  First/Initial      Initial Visit   Diabetes Type  Gestational Diabetes    Are you currently following a meal plan?  No    Are you taking your medications as prescribed?  Yes    Date Diagnosed  last week      Health Coping   How would you rate your overall health?  Good      Psychosocial Assessment   Patient Belief/Attitude about Diabetes  Other (comment) "overwhelmed"    Self-care barriers  None    Self-management support  Doctor's office;Family    Other persons present  Spouse/SO    Patient Concerns  Nutrition/Meal planning;Glycemic Control;Monitoring    Special Needs  None    Preferred Learning Style  Visual;Hands on    Learning Readiness  Change in progress    How often do you need to have someone help you when you read instructions, pamphlets, or other written materials from your doctor or pharmacy?  1 - Never    What is the last grade level you completed in school?  high school      Pre-Education Assessment   Patient understands the diabetes disease and treatment process.  Needs Instruction    Patient understands incorporating nutritional management into lifestyle.  Needs Instruction    Patient undertands incorporating physical activity into lifestyle.  Needs Instruction    Patient understands using medications safely.  Needs Instruction    Patient understands monitoring blood glucose, interpreting and using results  Needs Instruction    Patient understands prevention,  detection, and treatment of acute complications.  Needs Instruction    Patient understands prevention, detection, and treatment of chronic complications.  Needs Instruction    Patient understands how to develop strategies to address psychosocial issues.  Needs Instruction    Patient understands how to develop strategies to promote health/change behavior.  Needs Instruction      Complications   How often do you check your blood sugar?  0 times/day (not testing) Pt will be picking up her glucometer today from her pharmacy that is covered by her insurance. BG in the office was 98 mg/dL at 30:8611:45 am - 3 hrs pp.    Have you had a dilated eye exam in the past 12 months?  Yes    Have you had a dental exam in the past 12 months?  Yes    Are you checking your feet?  No      Dietary Intake   Breakfast  waffles, pancakes, low fat yogurt, oatmeal    Snack (morning)  9:00 am and sometimes 2:30 am when her husband goes to work - applesauce, yogurt, granola bar    Lunch  left overs from supper    Snack (afternoon)  same as morning    Dinner  chicken, steak, talapia, corn, asparagus, squash, bread, potatoes, rice, pasta, peas, beans, celery, cuccumber, salads    Snack (evening)  same as morning    Beverage(s)  water, occasional sweetened  tea, juice      Exercise   Exercise Type  Light (walking / raking leaves) push mowing    How many days per week to you exercise?  2    How many minutes per day do you exercise?  120    Total minutes per week of exercise  240      Patient Education   Previous Diabetes Education  No    Disease state   Definition of diabetes, type 1 and 2, and the diagnosis of diabetes;Factors that contribute to the development of diabetes    Nutrition management   Role of diet in the treatment of diabetes and the relationship between the three main macronutrients and blood glucose level;Reviewed blood glucose goals for pre and post meals and how to evaluate the patients' food intake on  their blood glucose level.    Physical activity and exercise   Role of exercise on diabetes management, blood pressure control and cardiac health.    Monitoring  Taught/evaluated SMBG meter.;Purpose and frequency of SMBG.;Taught/discussed recording of test results and interpretation of SMBG.;Ketone testing, when, how.    Chronic complications  Relationship between chronic complications and blood glucose control    Psychosocial adjustment  Identified and addressed patients feelings and concerns about diabetes    Preconception care  Pregnancy and GDM  Role of pre-pregnancy blood glucose control on the development of the fetus;Role of family planning for patients with diabetes;Reviewed with patient blood glucose goals with pregnancy      Individualized Goals (developed by patient)   Reducing Risk  Improve blood sugars Prevent diabetes complications     Outcomes   Expected Outcomes  Demonstrated interest in learning. Expect positive outcomes    Future DMSE  2 wks       Individualized Plan for Diabetes Self-Management Training:   Learning Objective:  Patient will have a greater understanding of diabetes self-management. Patient education plan is to attend individual and/or group sessions per assessed needs and concerns.   Plan:   Patient Instructions  Read booklet on Gestational Diabetes Follow Gestational Meal Planning Guidelines Limit desserts and sweets Avoid sugar sweetened drinks (tea, juice) Complete a 3 Day Food Record and bring to next appointment Check blood sugars 4 x day - before breakfast and 2 hrs after every meal and record  Bring blood sugar log to all appointments Purchase urine ketone strips if blood sugars not controlled and check urine ketones every am:  If + increase bedtime snack to 1 protein and 2 carbohydrate servings Walk 20-30 minutes at least 5 x week if permitted by MD  Expected Outcomes:  Demonstrated interest in learning. Expect positive  outcomes  Education material provided:  Gestational Booklet Gestational Meal Planning Guidelines Simple Meal Plan Viewed Gestational Diabetes Video 3 Day Food Record Goals for a Healthy Pregnancy  If problems or questions, patient to contact team via:  Sharion Settler, RN, CCM, CDE [phone]  Future DSME appointment:  July 15, 2017 with the dietitian

## 2017-07-15 ENCOUNTER — Encounter: Payer: Self-pay | Admitting: Dietician

## 2017-07-15 ENCOUNTER — Encounter: Payer: BLUE CROSS/BLUE SHIELD | Admitting: Dietician

## 2017-07-15 VITALS — BP 108/74 | Ht 62.0 in | Wt 144.0 lb

## 2017-07-15 DIAGNOSIS — Z713 Dietary counseling and surveillance: Secondary | ICD-10-CM | POA: Diagnosis not present

## 2017-07-15 DIAGNOSIS — O24410 Gestational diabetes mellitus in pregnancy, diet controlled: Secondary | ICD-10-CM

## 2017-07-15 NOTE — Patient Instructions (Signed)
   Continue with current healthy eating pattern and regular physical activity.  If you feel low BG symptoms, check blood sugar, and if in low 60s or lower, treat with 4oz juice or regular soda, allow 10-15 minutes for BG to rise and symptoms to improve, then follow with meal or snack.

## 2017-07-15 NOTE — Progress Notes (Signed)
   Patient's BG record indicates BGs are within goals, with fasting BGs ranging 69-83, and post-meal BGs ranging 76-120.  Patient reports feeling hypoglycemic symptoms when she woke up with BG of 69. She ate her normal oatmeal breakfast but still felt bad, so ate a protein bar. Instructed patient on treatment of low BG and possible causes.   Patient's food diary indicates well-balanced meals and healthy food choices.   Provided 1800kcal meal plan, and wrote individualized menus based on patient's food preferences.  Instructed patient on food safety, including avoidance of Listeriosis, and limiting mercury from fish.  Discussed importance of maintaining healthy lifestyle habits to reduce risk of Type 2 DM as well as Gestational DM with any future pregnancies.  Advised patient to use any remaining testing supplies to test some BGs after delivery, and to have BG tested ideally annually, as well as prior to attempting future pregnancies.

## 2017-07-20 ENCOUNTER — Encounter: Payer: Self-pay | Admitting: Maternal Newborn

## 2017-07-20 ENCOUNTER — Ambulatory Visit (INDEPENDENT_AMBULATORY_CARE_PROVIDER_SITE_OTHER): Payer: BLUE CROSS/BLUE SHIELD | Admitting: Maternal Newborn

## 2017-07-20 VITALS — BP 120/80 | Wt 142.0 lb

## 2017-07-20 DIAGNOSIS — O360130 Maternal care for anti-D [Rh] antibodies, third trimester, not applicable or unspecified: Secondary | ICD-10-CM

## 2017-07-20 DIAGNOSIS — O0993 Supervision of high risk pregnancy, unspecified, third trimester: Secondary | ICD-10-CM

## 2017-07-20 DIAGNOSIS — O99713 Diseases of the skin and subcutaneous tissue complicating pregnancy, third trimester: Secondary | ICD-10-CM

## 2017-07-20 DIAGNOSIS — Z369 Encounter for antenatal screening, unspecified: Secondary | ICD-10-CM

## 2017-07-20 DIAGNOSIS — O24410 Gestational diabetes mellitus in pregnancy, diet controlled: Secondary | ICD-10-CM

## 2017-07-20 DIAGNOSIS — Z3A34 34 weeks gestation of pregnancy: Secondary | ICD-10-CM

## 2017-07-20 DIAGNOSIS — L299 Pruritus, unspecified: Secondary | ICD-10-CM

## 2017-07-20 NOTE — Progress Notes (Signed)
Routine Prenatal Care Visit  Subjective  Christine Arroyo is a 22 y.o. G1P0000 at [redacted]w[redacted]d being seen today for ongoing prenatal care.  She is currently monitored for the following issues for this high-risk pregnancy and has Polyclonal gammopathy; Supervision of high risk pregnancy, antepartum, third trimester; Rh negative state in antepartum period, first trimester; Nausea and vomiting in pregnancy prior to [redacted] weeks gestation; Susceptible to Varicella (non-immune), currently pregnant in first trimester; and Gestational diabetes, diet controlled on their problem list.  ----------------------------------------------------------------------------------- Patient reports contractions since Monday.  They are painful and have been strong enough to cause her to vomit. They resolve spontaneously but have been returning every day. They have been as close together as 10 minutes today. She did see some mucus discharge that was tinged with blood on one occasion. Otherwise no vaginal bleeding or leaking of fluid. She also reports new onset of itching, especially on the palms of her hands and the soles of her feet. Contractions: Irregular. Vag. Bleeding: None.  Movement: Present ----------------------------------------------------------------------------------- The following portions of the patient's history were reviewed and updated as appropriate: allergies, current medications, past family history, past medical history, past social history, past surgical history and problem list. Problem list updated.   Objective  Blood pressure 120/80, weight 142 lb (64.4 kg), last menstrual period 11/15/2016. Pregravid weight 126 lb (57.2 kg) Total Weight Gain 16 lb (7.258 kg) Urinalysis:      Fetal Status: Fetal Heart Rate (bpm): 152 Fundal Height: 35 cm Movement: Present     General:  Alert, oriented and cooperative. Patient is in no acute distress.  Skin: Skin is warm and dry. No rash noted.   Cardiovascular: Normal  heart rate noted  Respiratory: Normal respiratory effort, no problems with respiration noted  Abdomen: Soft, gravid, appropriate for gestational age. Pain/Pressure: Present     Pelvic:  Cervical exam performed Dilation: Closed Effacement (%): Thick Station: Ballotable  Extremities: Normal range of motion.  Edema: None  Mental Status: Normal mood and affect. Normal behavior. Normal judgment and thought content.     Assessment   22 y.o. G1P0000 at [redacted]w[redacted]d, EDD  08/27/2017 by Ultrasound presenting for routine prenatal visit.  Plan   pregnancy #1 Problems (from 11/15/16 to present)    Problem Noted Resolved   Rh negative state in antepartum period, first trimester 01/20/2017 by Natale Milch, MD No   Nausea and vomiting in pregnancy prior to [redacted] weeks gestation 01/20/2017 by Natale Milch, MD No   Susceptible to Varicella (non-immune), currently pregnant in first trimester 01/20/2017 by Natale Milch, MD No   Supervision of high risk pregnancy, antepartum, third trimester 01/17/2017 by Natale Milch, MD No   Overview Addendum 06/05/2017  8:53 PM by Farrel Conners, CNM      Clinic [facility] Prenatal Labs  Dating  Blood type: O/Negative/-- (12/24 0910)   Genetic Screen Declines   Antibody:Negative (12/24 0910)  Anatomic [place] Complete 04/08/2017 Rubella: 2.61 (12/24 0910)  Varicella: Nonimmune  GTT Early:        28 wk:      RPR: Non Reactive (12/24 0910)   Rhogam  5/10 HBsAg: Negative (12/24 0910)   TDaP vaccine                       HIV: Non Reactive (12/24 0910)   Flu Shot  GBS:   Contraception  Pap: 07/25/2016  CBB     CS/VBAC NA   Baby Food Breast   Support Person  Husband Vincenza HewsShane            Return tomorrow fasting for bile acids and other labs to determine if itching is a symptom of ICP.  Cervix is closed today, but advised her to go to triage with any signs of labor as she is having painful contractions  daily.  Reviewed blood glucose log, all readings within parameters except as noted below:  Fasting values range from 69-83 Breakfast PP values range from 76-120 Lunch PP values range from 73-110 Dinner PP: normal values range from 84-120, 2 abnormal values over 15 readings (126 and 165).  Preterm labor symptoms and general obstetric precautions were reviewed in detail.  Return in about 1 week (around 07/27/2017) for HROB with growth/AFI.  Marcelyn BruinsJacelyn Kelvin Burpee, CNM 07/20/2017  2:55 PM

## 2017-07-20 NOTE — Progress Notes (Signed)
C/o Mon started having cts every lasting 45sec. Tues apart lasting 35sec.  Today on and off ctxs .  Some have been so strong it made her vomit.  Has also had diarrhea ctxs.  Felt a lot of pressure.  Baby has dropped too. Has had some spotting. May have passed some mucus plug as well.rj

## 2017-07-21 ENCOUNTER — Other Ambulatory Visit: Payer: Self-pay

## 2017-07-21 ENCOUNTER — Inpatient Hospital Stay
Admission: EM | Admit: 2017-07-21 | Discharge: 2017-07-26 | DRG: 805 | Disposition: A | Discharge status: Home / Self Care | Payer: BLUE CROSS/BLUE SHIELD | Attending: Obstetrics and Gynecology | Admitting: Obstetrics and Gynecology

## 2017-07-21 ENCOUNTER — Other Ambulatory Visit: Payer: BLUE CROSS/BLUE SHIELD

## 2017-07-21 DIAGNOSIS — O26893 Other specified pregnancy related conditions, third trimester: Secondary | ICD-10-CM | POA: Diagnosis present

## 2017-07-21 DIAGNOSIS — O24420 Gestational diabetes mellitus in childbirth, diet controlled: Principal | ICD-10-CM | POA: Diagnosis present

## 2017-07-21 DIAGNOSIS — R748 Abnormal levels of other serum enzymes: Secondary | ICD-10-CM | POA: Diagnosis present

## 2017-07-21 DIAGNOSIS — O42913 Preterm premature rupture of membranes, unspecified as to length of time between rupture and onset of labor, third trimester: Secondary | ICD-10-CM | POA: Diagnosis present

## 2017-07-21 DIAGNOSIS — K831 Obstruction of bile duct: Secondary | ICD-10-CM | POA: Diagnosis present

## 2017-07-21 DIAGNOSIS — O9081 Anemia of the puerperium: Secondary | ICD-10-CM | POA: Diagnosis not present

## 2017-07-21 DIAGNOSIS — O24410 Gestational diabetes mellitus in pregnancy, diet controlled: Secondary | ICD-10-CM | POA: Diagnosis present

## 2017-07-21 DIAGNOSIS — O2662 Liver and biliary tract disorders in childbirth: Secondary | ICD-10-CM | POA: Diagnosis present

## 2017-07-21 DIAGNOSIS — Z880 Allergy status to penicillin: Secondary | ICD-10-CM

## 2017-07-21 DIAGNOSIS — O4693 Antepartum hemorrhage, unspecified, third trimester: Secondary | ICD-10-CM | POA: Diagnosis present

## 2017-07-21 DIAGNOSIS — Z6791 Unspecified blood type, Rh negative: Secondary | ICD-10-CM

## 2017-07-21 DIAGNOSIS — D62 Acute posthemorrhagic anemia: Secondary | ICD-10-CM | POA: Diagnosis not present

## 2017-07-21 DIAGNOSIS — Z3A34 34 weeks gestation of pregnancy: Secondary | ICD-10-CM

## 2017-07-21 DIAGNOSIS — L299 Pruritus, unspecified: Secondary | ICD-10-CM | POA: Diagnosis present

## 2017-07-21 LAB — GLUCOSE, CAPILLARY
GLUCOSE-CAPILLARY: 83 mg/dL (ref 70–99)
Glucose-Capillary: 128 mg/dL — ABNORMAL HIGH (ref 70–99)

## 2017-07-21 LAB — URINALYSIS, COMPLETE (UACMP) WITH MICROSCOPIC
Bacteria, UA: NONE SEEN
Bilirubin Urine: NEGATIVE
Glucose, UA: 150 mg/dL — AB
Hgb urine dipstick: NEGATIVE
Ketones, ur: 5 mg/dL — AB
Leukocytes, UA: NEGATIVE
Nitrite: NEGATIVE
PROTEIN: NEGATIVE mg/dL
Specific Gravity, Urine: 1.013 (ref 1.005–1.030)
pH: 7 (ref 5.0–8.0)

## 2017-07-21 LAB — COMPREHENSIVE METABOLIC PANEL
ALT: 250 U/L — ABNORMAL HIGH (ref 0–44)
ANION GAP: 8 (ref 5–15)
AST: 130 U/L — ABNORMAL HIGH (ref 15–41)
Albumin: 2.9 g/dL — ABNORMAL LOW (ref 3.5–5.0)
Alkaline Phosphatase: 180 U/L — ABNORMAL HIGH (ref 38–126)
BUN: 11 mg/dL (ref 6–20)
CHLORIDE: 107 mmol/L (ref 98–111)
CO2: 22 mmol/L (ref 22–32)
Calcium: 8.8 mg/dL — ABNORMAL LOW (ref 8.9–10.3)
Creatinine, Ser: 0.61 mg/dL (ref 0.44–1.00)
GFR calc non Af Amer: >60 mL/min (ref >60)
Glucose, Bld: 75 mg/dL (ref 70–99)
Potassium: 3.7 mmol/L (ref 3.5–5.1)
SODIUM: 137 mmol/L (ref 135–145)
Total Bilirubin: 1.1 mg/dL (ref 0.3–1.2)
Total Protein: 6.3 g/dL — ABNORMAL LOW (ref 6.5–8.1)

## 2017-07-21 LAB — CBC
HCT: 31 % — ABNORMAL LOW (ref 35.0–47.0)
Hemoglobin: 10.6 g/dL — ABNORMAL LOW (ref 12.0–16.0)
MCH: 29.3 pg (ref 26.0–34.0)
MCHC: 34 g/dL (ref 32.0–36.0)
MCV: 86.2 fL (ref 80.0–100.0)
PLATELETS: 226 10*3/uL (ref 150–440)
RBC: 3.6 MIL/uL — ABNORMAL LOW (ref 3.80–5.20)
RDW: 14.6 % — ABNORMAL HIGH (ref 11.5–14.5)
WBC: 10.2 10*3/uL (ref 3.6–11.0)

## 2017-07-21 LAB — PROTEIN / CREATININE RATIO, URINE
Creatinine, Urine: 91 mg/dL
PROTEIN CREATININE RATIO: 0.15 mg/mg{creat} (ref 0.00–0.15)
TOTAL PROTEIN, URINE: 14 mg/dL

## 2017-07-21 LAB — KLEIHAUER-BETKE STAIN
# VIALS RHIG: 1
FETAL CELLS %: 0 %
QUANTITATION FETAL HEMOGLOBIN: 0 mL

## 2017-07-21 LAB — GROUP B STREP BY PCR: Group B strep by PCR: NEGATIVE

## 2017-07-21 LAB — ABO/RH: ABO/RH(D): O NEG

## 2017-07-21 LAB — APTT: aPTT: 27 seconds (ref 24–36)

## 2017-07-21 LAB — OB RESULTS CONSOLE GBS: GBS: NEGATIVE

## 2017-07-21 LAB — PROTIME-INR
INR: 0.92
PROTHROMBIN TIME: 12.3 s (ref 11.4–15.2)

## 2017-07-21 MED ORDER — ONDANSETRON HCL 4 MG/2ML IJ SOLN
INTRAMUSCULAR | Status: AC
  Filled 2017-07-21: qty 2

## 2017-07-21 MED ORDER — SODIUM CHLORIDE 0.9 % IV SOLN
8.0000 mg | Freq: Four times a day (QID) | INTRAVENOUS | Status: DC | PRN
  Administered 2017-07-21 – 2017-07-22 (×3): 8 mg via INTRAVENOUS
  Filled 2017-07-21 (×3): qty 4

## 2017-07-21 MED ORDER — DEXTROSE IN LACTATED RINGERS 5 % IV SOLN
INTRAVENOUS | Status: DC
  Administered 2017-07-21: 999 mL via INTRAVENOUS
  Administered 2017-07-22: 1000 mL via INTRAVENOUS
  Administered 2017-07-22 (×2): via INTRAVENOUS

## 2017-07-21 MED ORDER — ACETAMINOPHEN 325 MG PO TABS
ORAL_TABLET | ORAL | Status: AC
  Filled 2017-07-21: qty 2

## 2017-07-21 MED ORDER — BETAMETHASONE SOD PHOS & ACET 6 (3-3) MG/ML IJ SUSP
12.0000 mg | INTRAMUSCULAR | Status: AC
  Administered 2017-07-21 – 2017-07-22 (×2): 12 mg via INTRAMUSCULAR

## 2017-07-21 MED ORDER — SODIUM CHLORIDE FLUSH 0.9 % IV SOLN
INTRAVENOUS | Status: AC
  Administered 2017-07-21: 10 mL
  Filled 2017-07-21: qty 10

## 2017-07-21 MED ORDER — BETAMETHASONE SOD PHOS & ACET 6 (3-3) MG/ML IJ SUSP
INTRAMUSCULAR | Status: AC
  Administered 2017-07-22: 12 mg via INTRAMUSCULAR
  Filled 2017-07-21: qty 1

## 2017-07-21 MED ORDER — ACETAMINOPHEN 500 MG PO TABS
1000.0000 mg | ORAL_TABLET | Freq: Three times a day (TID) | ORAL | Status: DC | PRN
  Administered 2017-07-21 – 2017-07-22 (×2): 1000 mg via ORAL
  Filled 2017-07-21 (×2): qty 2

## 2017-07-21 NOTE — OB Triage Note (Addendum)
Pt c/o bright red gush of blood around 12:20pm, she feels ctx and pressure. Denies any intercourse the last few days. Ctx the last couple of days with some diarrhea and vomiting.

## 2017-07-21 NOTE — OB Triage Note (Signed)
Patient sitting up in bed eating. Reports that she has not had any more bleeding, but still noting some abdominal tightness/pressure and lower back cramping that has seemed to space out a little. Abdomen palpated soft and fetal movement palpated. Toco adjusted. Discussed plan of care with patient and spouse who verbalized understanding.

## 2017-07-21 NOTE — H&P (Signed)
OB History & Physical   History of Present Illness:  Chief Complaint: vaginal bleeding  HPI:  Christine Arroyo is a 22 y.o. G1P0000 female at 33w5ddated by early 1st trimester ultrasound.  Her pregnancy has been complicated by Rh negative and diet-controlled gestational diabetes.    She reports contractions.   She denies leakage of fluid.   She reports vaginal bleeding.   She reports fetal movement.   She has been having contractions off and on all week.  She was seen in clinic yesterday and evaluated. Her cervix was documented as being closed.  Also over the past couple of weeks she has begun to have diffuse itching, also noted in her palms and soles.  She had testing this morning for total bile acids.    She had an episode of bleeding early today while in the shower.  She noted a large clot that passed. She has continued to have vaginal bleeding and worsening abdominal pain.   Maternal Medical History:   Past Medical History:  Diagnosis Date  . Gestational diabetes   . Migraines   . Numbness and tingling of both legs   . PCOS (polycystic ovarian syndrome)   . Poor circulation of extremity     Past Surgical History:  Procedure Laterality Date  . APPENDECTOMY  2011    Allergies  Allergen Reactions  . Cephalexin Hives  . Guaifenesin Hives  . Penicillins Hives    Has patient had a PCN reaction causing immediate rash, facial/tongue/throat swelling, SOB or lightheadedness with hypotension: Yes Has patient had a PCN reaction causing severe rash involving mucus membranes or skin necrosis: No Has patient had a PCN reaction that required hospitalization No Has patient had a PCN reaction occurring within the last 10 years: No If all of the above answers are "NO", then may proceed with Cephalosporin use.  . Sulfa Antibiotics Hives  . Amoxicillin Hives  . Azithromycin Hives  . Morphine And Related Hives  . Prenatal Vitamins Nausea And Vomiting    Prior to Admission medications    Medication Sig Start Date End Date Taking? Authorizing Provider  Ascorbic Acid (VITAMIN C) 1000 MG tablet Take 1,000 mg by mouth daily.    [provider]  Blood Glucose Monitoring Suppl ([place] w/[place] KIT 1 kit by Other route 4 (four) times daily. 07/04/17   SRexene Agent CNM  glucose blood test strip Use as instructed 07/04/17   SRexene Agent CNM  RELION ULTRA THIN LANCETS 30G MISC 1 Units by Percutaneous route 4 (four) times daily. 07/04/17   SRexene Agent CNM    OB History  Gravida Para Term Preterm AB Living  1 0 0     0  SAB TAB Ectopic Multiple Live Births               # Outcome Date GA Lbr Len/2nd Weight Sex Delivery Anes PTL Lv  1 Current             Prenatal care site: [facility]  Social History: She  reports that she has never smoked. She has never used smokeless tobacco. She reports that she does not drink alcohol or use drugs.  Family History: family history includes Diabetes in her paternal grandfather.   Review of Systems: Negative x 10 systems reviewed except as noted in the HPI.    Physical Exam:  Vital Signs: BP 126/73   Pulse 84   Temp 97.6 F (36.4 C) (Oral)  Resp 18   LMP 11/15/2016 (Exact Date)  Constitutional: Well nourished, well developed female in no acute distress.  HEENT: normal Skin: Warm and dry.  Cardiovascular: Regular rate and rhythm.   Extremity: no edema  Respiratory: Clear to auscultation bilateral. Normal respiratory effort Abdomen: FHT present and left lower lateral abdominal tenderness.  Back: no CVAT Neuro: DTRs 2+, Cranial nerves grossly intact Psych: Alert and Oriented x3. No memory deficits. Normal mood and affect.  MS: normal gait, normal bilateral lower extremity ROM/strength/stability.  Pelvic exam: (female chaperone present) is not limited by body habitus EGBUS: within normal limits Vagina: within normal limits and with no blood in the vault Cervix:  1/50/-2/mid/soft  Lab Results: Kleihauer-Betke Stain: negative  Lab Results  Component Value Date   GLUCAP 83 07/21/2017    Lab Results  Component Value Date   WBC 10.2 07/21/2017   HGB 10.6 (L) 07/21/2017   HCT 31.0 (L) 07/21/2017   PLT 226 07/21/2017   Lab Results  Component Value Date   NA 137 07/21/2017   K 3.7 07/21/2017   GLUCOSE 75 07/21/2017   CREATININE 0.61 07/21/2017   AST 130 (H) 07/21/2017   ALT 250 (H) 07/21/2017   BILITOT 1.1 07/21/2017      Lab Results  Component Value Date   PROTCRRATIO 0.15 07/21/2017    Lab Results  Component Value Date   INR 0.92 07/21/2017      Lab Results  Component Value Date   GRPBSTRPPCR NEGATIVE 07/21/2017     Pertinent Results:  Prenatal Labs: Blood type/Rh O negative  Antibody screen negative  Rubella Immune  Varicella Not immune    RPR NR  HBsAg negative  HIV negative  GC negative  Chlamydia negative  Genetic screening declined  1 hour GTT 140  3 hour GTT 75, 187(H), 185(H), 148(H)  GBS unknown   Baseline FHR: 125 beats/min   Variability: moderate   Accelerations: present   Decelerations: absent Contractions: present frequency: irregular, but frequent at times Overall assessment: category 1  Bedside Ultrasound:  Number of Fetus: 1  Presentation: cephalic  Fluid: subjectively normal  Placental Location: posterior and left lateral with a small, possible succenturiate lobe extending anteriorly on the left  -- no evidence of abruption EFW by fetal biometry: 2,541 grams (5 lb 10 oz)  Assessment:  Christine Arroyo is a 22 y.o. G1P0000 female at 4w5dwith vaginal bleeding, preterm labor, diffuse pruritis including palms and soles, transaminitis    Plan:  1. Admit to Labor & Delivery for observation 2. CBC, T&S, NPO IVF 3. GBS negative by PCR - will treat with Vancomycin.   4. Fetwal well-being: reassuring - category 1 5. Elevated liver enzymes: initial blood pressure was elevated, but has been normal  since. She has no significant proteinuria.  Her blood glucose is normal. She may have Intrahepatic Cholestasis of Pregnancy (bile acids are pending). Her total bilirubin and PT/INR and aPTT are normal. Will obtain hepatitis panel, though she is low risk for hepatitis. No evidence of HELLP syndrome based on other laboratory results.   Will monitor closely.  6. Preterm labor: will give betamethasone given early gestational age. Will start vancomycin if there is any significant change in her cervix 7. Vaginal bleeding: no evidence of bleeding on exam. She had another episode of bleeding (light pink on pad).  Cervix check right after this and no bright red blood on glove.  Continue to monitor.    SPrentice Docker MD 07/21/2017 10:42 PM

## 2017-07-22 ENCOUNTER — Other Ambulatory Visit: Payer: Self-pay

## 2017-07-22 LAB — URINE DRUG SCREEN, QUALITATIVE (ARMC ONLY)
Amphetamines, Ur Screen: NOT DETECTED
BENZODIAZEPINE, UR SCRN: NOT DETECTED
CANNABINOID 50 NG, UR ~~LOC~~: NOT DETECTED
Cocaine Metabolite,Ur ~~LOC~~: NOT DETECTED
MDMA (Ecstasy)Ur Screen: NOT DETECTED
Methadone Scn, Ur: NOT DETECTED
Opiate, Ur Screen: NOT DETECTED
PHENCYCLIDINE (PCP) UR S: NOT DETECTED
TRICYCLIC, UR SCREEN: NOT DETECTED

## 2017-07-22 LAB — KLEIHAUER-BETKE STAIN
# Vials RhIg: 1
Fetal Cells %: 0 %
QUANTITATION FETAL HEMOGLOBIN: 0 mL

## 2017-07-22 LAB — CHLAMYDIA/NGC RT PCR (ARMC ONLY)
CHLAMYDIA TR: NOT DETECTED
N gonorrhoeae: NOT DETECTED

## 2017-07-22 NOTE — Plan of Care (Signed)
Discussed plan of care with patient. Patient and spouse verbalized understanding and agreed to plan. 

## 2017-07-22 NOTE — Progress Notes (Signed)
Subjective:  Has had some continued light bleeding today, intermittent, but with passage of small clots.  Continues to have right fundal tenderness/abdominal pain.  Occassional contractions.  +FM, no LOF.   Objective:   Vitals: Blood pressure 112/78, pulse 99, temperature 98 F (36.7 C), temperature source Oral, resp. rate 16, last menstrual period 11/15/2016. General:  Abdomen: Cervical Exam:  Dilation: 1.5 Effacement (%): 50 Cervical Position: Posterior Station: -2 Presentation: Vertex Exam by:: Edison Pace MD  FHT: 135, moderate variability, +accels, no decels Toco:rare  Results for orders placed or performed during the hospital encounter of 07/21/17 (from the past 24 hour(s))  ABO/Rh     Status: None   Collection Time: 07/21/17  9:37 PM  Result Value Ref Range   ABO/RH(D)      O NEG Performed at [facility], [address] [place], [place] [address]   Glucose, capillary     Status: Abnormal   Collection Time: 07/21/17 11:25 PM  Result Value Ref Range   Glucose-Capillary 128 (H) 70 - 99 mg/dL  Urine Drug Screen, Qualitative ([facility] only)     Status: Abnormal   Collection Time: 07/22/17  3:34 PM  Result Value Ref Range   Tricyclic, Ur Screen NONE DETECTED NONE DETECTED   Amphetamines, Ur Screen NONE DETECTED NONE DETECTED   MDMA (Ecstasy)Ur Screen NONE DETECTED NONE DETECTED   Cocaine Metabolite,Ur [place] NONE DETECTED NONE DETECTED   Opiate, Ur Screen NONE DETECTED NONE DETECTED   Phencyclidine (PCP) Ur S NONE DETECTED NONE DETECTED   Cannabinoid 50 Ng, Ur [place] NONE DETECTED NONE DETECTED   Barbiturates, Ur Screen (A) NONE DETECTED    Result not available. Reagent lot number recalled by manufacturer.   Benzodiazepine, Ur Scrn NONE DETECTED NONE DETECTED   Methadone Scn, Ur NONE DETECTED NONE DETECTED  Chlamydia/NGC rt PCR ([facility] only)     Status: None   Collection Time: 07/22/17  3:34 PM  Result Value Ref Range   Specimen source GC/Chlam URINE, RANDOM    Chlamydia Tr NOT DETECTED NOT DETECTED   N gonorrhoeae NOT DETECTED NOT DETECTED    Assessment:   22 y.o. G1P0000 [redacted]w[redacted]d with third trimester bleeding and abdominal pain  Plan:   1) Vaginal bleeding - less since admission than what initially prompted presentation yesterday.  Initial presentation concerning for abruption given third trimester bleeding and abdominal pain, with no evidence of previa on prior imaging or bedside ultrasound by Dr. Jean Rosenthal yesterday.  Tracing has remained category I, Kleihauer-Betke negative with repeat ordered this afternoon pending.    The patient has no identifiable risk factors for abruption such as hypertension, cocaine use, PPROM, or abdominal trauma.  Preterm labor remains in the differential as she was noted to be 1.5cm dilated on initial presentation and reports being closed on most recent clinic check prior to that. - set up 2 units of packed red blood cells - continuous fetal monitoring - received second dose of BMZ this afternoon - clears only should emergent Cesarean section be required - discussed the pros and cons of mode of delivery with MFM and patient.  Labor may worsen abruption, rupture may also result in increased bleeding by taking some pressure of the utero-placental interface.  Should she labor an have worsening of abruption DIC going into an emergent section, general anesthesia as opposed to regional anesthesia, as well as response time for anesthesia are all factors that need to be taken into consideration.  The patient is leaning towards cesarean section  if concern for worsening or acute abruption.  In the setting of clinical picture more consistent with preterm labor vaginal delivery is feasible - Given unclear clinical picture if remains stable over the course of the weekend will obtain MFM consult on Monday 07/25/17  2) Pruritus - bile acid from 07/21/2017 pending at this time  3) Fetus - category I tracing since admission  4) Disposition -  depending on maternal or fetal status, at this point continued observation  Vena AustriaAndreas Jazaria Jarecki, MD, Merlinda FrederickFACOG [facility], [facility] 07/22/2017, 6:23 PM

## 2017-07-23 ENCOUNTER — Inpatient Hospital Stay: Payer: BLUE CROSS/BLUE SHIELD | Admitting: Anesthesiology

## 2017-07-23 ENCOUNTER — Inpatient Hospital Stay: Payer: BLUE CROSS/BLUE SHIELD

## 2017-07-23 DIAGNOSIS — O24420 Gestational diabetes mellitus in childbirth, diet controlled: Secondary | ICD-10-CM | POA: Diagnosis present

## 2017-07-23 DIAGNOSIS — O4693 Antepartum hemorrhage, unspecified, third trimester: Secondary | ICD-10-CM | POA: Diagnosis present

## 2017-07-23 DIAGNOSIS — O360130 Maternal care for anti-D [Rh] antibodies, third trimester, not applicable or unspecified: Secondary | ICD-10-CM | POA: Diagnosis not present

## 2017-07-23 DIAGNOSIS — Z880 Allergy status to penicillin: Secondary | ICD-10-CM | POA: Diagnosis not present

## 2017-07-23 DIAGNOSIS — Z6791 Unspecified blood type, Rh negative: Secondary | ICD-10-CM | POA: Diagnosis not present

## 2017-07-23 DIAGNOSIS — R748 Abnormal levels of other serum enzymes: Secondary | ICD-10-CM | POA: Diagnosis present

## 2017-07-23 DIAGNOSIS — O9081 Anemia of the puerperium: Secondary | ICD-10-CM | POA: Diagnosis not present

## 2017-07-23 DIAGNOSIS — O26893 Other specified pregnancy related conditions, third trimester: Secondary | ICD-10-CM | POA: Diagnosis present

## 2017-07-23 DIAGNOSIS — O42913 Preterm premature rupture of membranes, unspecified as to length of time between rupture and onset of labor, third trimester: Secondary | ICD-10-CM | POA: Diagnosis present

## 2017-07-23 DIAGNOSIS — K831 Obstruction of bile duct: Secondary | ICD-10-CM | POA: Diagnosis present

## 2017-07-23 DIAGNOSIS — L299 Pruritus, unspecified: Secondary | ICD-10-CM | POA: Diagnosis present

## 2017-07-23 DIAGNOSIS — O2662 Liver and biliary tract disorders in childbirth: Secondary | ICD-10-CM | POA: Diagnosis present

## 2017-07-23 DIAGNOSIS — Z3A34 34 weeks gestation of pregnancy: Secondary | ICD-10-CM | POA: Diagnosis not present

## 2017-07-23 DIAGNOSIS — O24410 Gestational diabetes mellitus in pregnancy, diet controlled: Secondary | ICD-10-CM | POA: Diagnosis not present

## 2017-07-23 DIAGNOSIS — D62 Acute posthemorrhagic anemia: Secondary | ICD-10-CM | POA: Diagnosis not present

## 2017-07-23 LAB — IRON AND TIBC
Iron: 14 ug/dL — ABNORMAL LOW (ref 28–170)
SATURATION RATIOS: 3 % — AB (ref 10.4–31.8)
TIBC: 555 ug/dL — AB (ref 250–450)
UIBC: 541 ug/dL

## 2017-07-23 LAB — URINALYSIS, COMPLETE (UACMP) WITH MICROSCOPIC
BACTERIA UA: NONE SEEN
Bilirubin Urine: NEGATIVE
Glucose, UA: NEGATIVE mg/dL
Hgb urine dipstick: NEGATIVE
Ketones, ur: NEGATIVE mg/dL
LEUKOCYTES UA: NEGATIVE
Nitrite: NEGATIVE
PROTEIN: NEGATIVE mg/dL
Specific Gravity, Urine: 1.001 — ABNORMAL LOW (ref 1.005–1.030)
pH: 7 (ref 5.0–8.0)

## 2017-07-23 LAB — FERRITIN: Ferritin: 8 ng/mL — ABNORMAL LOW (ref 11–307)

## 2017-07-23 LAB — COMPREHENSIVE METABOLIC PANEL
A/G RATIO: 1.3 (ref 1.2–2.2)
ALBUMIN: 3.6 g/dL (ref 3.5–5.5)
ALBUMIN: 2.9 g/dL — AB (ref 3.5–5.0)
ALK PHOS: 158 U/L — AB (ref 38–126)
ALT: 252 IU/L — ABNORMAL HIGH (ref 0–32)
ALT: 285 U/L — ABNORMAL HIGH (ref 0–44)
ANION GAP: 8 (ref 5–15)
AST: 125 IU/L — AB (ref 0–40)
AST: 143 U/L — ABNORMAL HIGH (ref 15–41)
Alkaline Phosphatase: 190 IU/L — ABNORMAL HIGH (ref 39–117)
BILIRUBIN TOTAL: 0.8 mg/dL (ref 0.0–1.2)
BILIRUBIN TOTAL: 0.9 mg/dL (ref 0.3–1.2)
BUN / CREAT RATIO: 14 (ref 9–23)
BUN: 9 mg/dL (ref 6–20)
BUN: 6 mg/dL (ref 6–20)
CALCIUM: 8.8 mg/dL — AB (ref 8.9–10.3)
CHLORIDE: 106 mmol/L (ref 96–106)
CO2: 19 mmol/L — AB (ref 20–29)
CO2: 19 mmol/L — ABNORMAL LOW (ref 22–32)
Calcium: 9.3 mg/dL (ref 8.7–10.2)
Chloride: 111 mmol/L (ref 98–111)
Creatinine, Ser: 0.66 mg/dL (ref 0.57–1.00)
Creatinine, Ser: 0.59 mg/dL (ref 0.44–1.00)
GFR calc Af Amer: 145 mL/min/{1.73_m2} (ref >59)
GFR calc non Af Amer: 126 mL/min/{1.73_m2} (ref >59)
GFR calc non Af Amer: >60 mL/min (ref >60)
GLUCOSE: 141 mg/dL — AB (ref 70–99)
Globulin, Total: 2.7 g/dL (ref 1.5–4.5)
Glucose: 63 mg/dL — ABNORMAL LOW (ref 65–99)
POTASSIUM: 4.4 mmol/L (ref 3.5–5.2)
Potassium: 3.5 mmol/L (ref 3.5–5.1)
Sodium: 140 mmol/L (ref 134–144)
Sodium: 138 mmol/L (ref 135–145)
TOTAL PROTEIN: 5.9 g/dL — AB (ref 6.5–8.1)
Total Protein: 6.3 g/dL (ref 6.0–8.5)

## 2017-07-23 LAB — HEPATITIS PANEL, ACUTE
HCV Ab: <0.1 s/co ratio (ref 0.0–0.9)
Hep A IgM: NEGATIVE
Hep B C IgM: NEGATIVE
Hepatitis B Surface Ag: NEGATIVE

## 2017-07-23 LAB — RETICULOCYTES
RBC.: 3.44 MIL/uL — AB (ref 3.80–5.20)
Retic Count, Absolute: 68.8 10*3/uL (ref 19.0–183.0)
Retic Ct Pct: 2 % (ref 0.4–3.1)

## 2017-07-23 LAB — LIPASE, BLOOD: Lipase: 29 U/L (ref 11–51)

## 2017-07-23 LAB — BILIRUBIN, FRACTIONATED(TOT/DIR/INDIR)
BILIRUBIN INDIRECT: 0.58 mg/dL (ref 0.10–0.80)
BILIRUBIN, DIRECT: 0.22 mg/dL (ref 0.00–0.40)

## 2017-07-23 LAB — CBC
HCT: 28.7 % — ABNORMAL LOW (ref 35.0–47.0)
Hemoglobin: 9.5 g/dL — ABNORMAL LOW (ref 12.0–16.0)
MCH: 28.7 pg (ref 26.0–34.0)
MCHC: 33.2 g/dL (ref 32.0–36.0)
MCV: 86.6 fL (ref 80.0–100.0)
Platelets: 231 10*3/uL (ref 150–440)
RBC: 3.32 MIL/uL — AB (ref 3.80–5.20)
RDW: 14.7 % — ABNORMAL HIGH (ref 11.5–14.5)
WBC: 14.1 10*3/uL — AB (ref 3.6–11.0)

## 2017-07-23 LAB — LACTIC ACID, PLASMA
LACTIC ACID, VENOUS: 2.9 mmol/L — AB (ref 0.5–1.9)
LACTIC ACID, VENOUS: 1.6 mmol/L (ref 0.5–1.9)

## 2017-07-23 LAB — VITAMIN B12: Vitamin B-12: 576 pg/mL (ref 180–914)

## 2017-07-23 LAB — GLUCOSE, CAPILLARY
GLUCOSE-CAPILLARY: 86 mg/dL (ref 70–99)
Glucose-Capillary: 119 mg/dL — ABNORMAL HIGH (ref 70–99)
Glucose-Capillary: 90 mg/dL (ref 70–99)
Glucose-Capillary: 90 mg/dL (ref 70–99)

## 2017-07-23 LAB — KLEIHAUER-BETKE STAIN
# Vials RhIg: 1
FETAL CELLS %: 0 %
Quantitation Fetal Hemoglobin: 0 mL

## 2017-07-23 LAB — LACTATE DEHYDROGENASE: LDH: 182 U/L (ref 98–192)

## 2017-07-23 LAB — PROTIME-INR
INR: 0.94
PROTHROMBIN TIME: 12.5 s (ref 11.4–15.2)

## 2017-07-23 LAB — BILE ACIDS, TOTAL: Bile Acids Total: 9.8 umol/L (ref 4.7–24.5)

## 2017-07-23 LAB — FOLATE: FOLATE: 14.6 ng/mL (ref >5.9)

## 2017-07-23 LAB — FIBRINOGEN: Fibrinogen: 444 mg/dL (ref 210–475)

## 2017-07-23 LAB — GAMMA GT: GGT: 15 U/L (ref 7–50)

## 2017-07-23 MED ORDER — BUTORPHANOL TARTRATE 2 MG/ML IJ SOLN
INTRAMUSCULAR | Status: AC
  Administered 2017-07-23: 1 mg via INTRAVENOUS
  Filled 2017-07-23: qty 1

## 2017-07-23 MED ORDER — DOCUSATE SODIUM 100 MG PO CAPS
100.0000 mg | ORAL_CAPSULE | Freq: Every day | ORAL | Status: DC | PRN
  Administered 2017-07-23: 100 mg via ORAL
  Filled 2017-07-23: qty 1

## 2017-07-23 MED ORDER — BUTORPHANOL TARTRATE 1 MG/ML IJ SOLN
0.5000 mg | INTRAMUSCULAR | Status: DC | PRN
  Administered 2017-07-24: 0.5 mg via INTRAVENOUS
  Filled 2017-07-23 (×2): qty 1

## 2017-07-23 MED ORDER — SODIUM CHLORIDE 0.9 % IV SOLN
INTRAVENOUS | Status: DC
  Administered 2017-07-23 – 2017-07-24 (×3): via INTRAVENOUS

## 2017-07-23 MED ORDER — SIMETHICONE 80 MG PO CHEW
80.0000 mg | CHEWABLE_TABLET | Freq: Four times a day (QID) | ORAL | Status: DC | PRN
  Administered 2017-07-23: 80 mg via ORAL
  Filled 2017-07-23: qty 1

## 2017-07-23 MED ORDER — FERROUS SULFATE 325 (65 FE) MG PO TABS
325.0000 mg | ORAL_TABLET | Freq: Two times a day (BID) | ORAL | Status: DC
  Administered 2017-07-23: 325 mg via ORAL
  Filled 2017-07-23: qty 1

## 2017-07-23 MED ORDER — DIPHENHYDRAMINE HCL 50 MG/ML IJ SOLN
25.0000 mg | Freq: Once | INTRAMUSCULAR | Status: AC | PRN
  Administered 2017-07-23: 25 mg via INTRAVENOUS
  Filled 2017-07-23: qty 1

## 2017-07-23 MED ORDER — FAMOTIDINE IN NACL 20-0.9 MG/50ML-% IV SOLN
20.0000 mg | INTRAVENOUS | Status: DC
  Administered 2017-07-23 – 2017-07-24 (×2): 20 mg via INTRAVENOUS
  Filled 2017-07-23 (×4): qty 50

## 2017-07-23 MED ORDER — BUTORPHANOL TARTRATE 2 MG/ML IJ SOLN
1.0000 mg | INTRAMUSCULAR | Status: DC | PRN
  Administered 2017-07-23 (×2): 2 mg via INTRAVENOUS
  Administered 2017-07-23: 1 mg via INTRAVENOUS
  Filled 2017-07-23 (×2): qty 1

## 2017-07-23 MED ORDER — SODIUM CHLORIDE 0.9 % IV BOLUS
1000.0000 mL | Freq: Once | INTRAVENOUS | Status: AC
  Administered 2017-07-23: 1000 mL via INTRAVENOUS

## 2017-07-23 MED ORDER — GI COCKTAIL ~~LOC~~
30.0000 mL | Freq: Once | ORAL | Status: AC
  Administered 2017-07-23: 30 mL via ORAL
  Filled 2017-07-23: qty 30

## 2017-07-23 MED ORDER — LACTATED RINGERS IV SOLN: INTRAVENOUS | Status: DC

## 2017-07-23 MED ORDER — SODIUM CHLORIDE FLUSH 0.9 % IV SOLN
INTRAVENOUS | Status: AC
  Filled 2017-07-23: qty 10

## 2017-07-23 NOTE — Progress Notes (Addendum)
Subjective:  Called because patient noted additional bleeding with droplets of blood when going to void, increasing abdominal pain.  No nausea or emesis.  Worsening left sided abdominal pain.  No nausea or emesis.  No fevers, no chills.  Pain is described as constant, does not come or go in waves.  Objective:   Vitals: Blood pressure (!) 141/70, pulse (!) 147, temperature 98.2 F (36.8 C), temperature source Oral, resp. rate 16, height 5\' 1"  (1.549 m), weight 143 lb (64.9 kg), last menstrual period 11/15/2016. General: In visible discomfort, patient shivering Abdomen: gravid, soft (including uterus) non-distended, with left upper quadrant pain Cervical Exam:  Patient reported tenderness when I was just touching and retracting the external labia or started my exam by inserting examining fingers in vagina. Dilation: 1.5 Effacement (%): 50 Cervical Position: Posterior Station: -2 Presentation: Vertex Exam by:: DR. Bonney AidStaebler   No blood noted on examining glove.  Sterile speculum exam performed with normal appearing cervix, not even a hint of blood noted in vaginal vault old or new.  Patient's prior pad inspected noting scant dark red blood on pad.    FHT: 130, moderate variability, _acce;s, one variable at 0130 Toco: irritability  Results for orders placed or performed during the hospital encounter of 07/21/17 (from the past 24 hour(s))  Kleihauer-Betke stain     Status: None   Collection Time: 07/22/17  1:50 PM  Result Value Ref Range   Fetal Cells % 0 %   Quantitation Fetal Hemoglobin 0.0000 mL   # Vials RhIg 1   Urine Drug Screen, Qualitative ([facility] only)     Status: Abnormal   Collection Time: 07/22/17  3:34 PM  Result Value Ref Range   Tricyclic, Ur Screen NONE DETECTED NONE DETECTED   Amphetamines, Ur Screen NONE DETECTED NONE DETECTED   MDMA (Ecstasy)Ur Screen NONE DETECTED NONE DETECTED   Cocaine Metabolite,Ur [place] NONE DETECTED NONE DETECTED   Opiate, Ur Screen NONE DETECTED  NONE DETECTED   Phencyclidine (PCP) Ur S NONE DETECTED NONE DETECTED   Cannabinoid 50 Ng, Ur [place] NONE DETECTED NONE DETECTED   Barbiturates, Ur Screen (A) NONE DETECTED    Result not available. Reagent lot number recalled by manufacturer.   Benzodiazepine, Ur Scrn NONE DETECTED NONE DETECTED   Methadone Scn, Ur NONE DETECTED NONE DETECTED  Chlamydia/NGC rt PCR ([facility] only)     Status: None   Collection Time: 07/22/17  3:34 PM  Result Value Ref Range   Specimen source GC/Chlam URINE, RANDOM    Chlamydia Tr NOT DETECTED NOT DETECTED   N gonorrhoeae NOT DETECTED NOT DETECTED  Prepare RBC     Status: None   Collection Time: 07/22/17  7:30 PM  Result Value Ref Range   Order Confirmation      ORDER PROCESSED BY BLOOD BANK Performed at [facility], [address] [place], [place] [address]     Assessment:   22 y.o. G1P0000 [id number] with vaginal bleeding and left upper quadrant abdominal pain  Plan:   1) Abdominal pain and bleeding - Repeat labs CBC, CMP, UA, lipase - Repeat UA no blood noted on vaginal exam as was reported by Dr. Jean RosenthalJackson during his exam yesterday, not even microscopic blood on UA clean catch yesterday - KB has been checked twice 24-hrs apart with no fetal cell noted in maternal circulation - Cervix remains stable - Famotidine and GI cocktail for left upper quadrant pain - Stadol IV  2) Fetus - cat I tacing with  positive fetal scalp stim  Vena Austria, MD, [facility], [facility] 07/23/2017, 1:33 AM

## 2017-07-23 NOTE — Progress Notes (Signed)
Patient called out for RN. RN to bedside to assess patient sitting on toilet with light red bleeding on peripad and in toilet. Pt reports very tender upper left abdomen. Patient assisted back to bed. Abdomen palpates moderate at this time. U/S and toco adjusted.

## 2017-07-23 NOTE — Progress Notes (Signed)
Subjective:  Went in to update patient on labs  Objective:   Vitals: Blood pressure (!) 111/50, pulse (!) 56, temperature 98.2 F (36.8 C), temperature source Oral, resp. rate 16, height 5\' 1"  (1.549 m), weight 143 lb (64.9 kg), last menstrual period 11/15/2016, SpO2 97 %. General: NAD Cervical Exam:  Dilation: 1.5 Effacement (%): 50 Cervical Position: Posterior Station: -2 Presentation: Vertex Exam by:: DR. Bonney AidStaebler  FHT: 130, moderate, no accels, on decel after returning from bathroom otherwise none Toco:  none  Results for orders placed or performed during the hospital encounter of 07/21/17 (from the past 24 hour(s))  Kleihauer-Betke stain     Status: None   Collection Time: 07/22/17  1:50 PM  Result Value Ref Range   Fetal Cells % 0 %   Quantitation Fetal Hemoglobin 0.0000 mL   # Vials RhIg 1   Urine Drug Screen, Qualitative ([facility] only)     Status: Abnormal   Collection Time: 07/22/17  3:34 PM  Result Value Ref Range   Tricyclic, Ur Screen NONE DETECTED NONE DETECTED   Amphetamines, Ur Screen NONE DETECTED NONE DETECTED   MDMA (Ecstasy)Ur Screen NONE DETECTED NONE DETECTED   Cocaine Metabolite,Ur [place] NONE DETECTED NONE DETECTED   Opiate, Ur Screen NONE DETECTED NONE DETECTED   Phencyclidine (PCP) Ur S NONE DETECTED NONE DETECTED   Cannabinoid 50 Ng, Ur [place] NONE DETECTED NONE DETECTED   Barbiturates, Ur Screen (A) NONE DETECTED    Result not available. Reagent lot number recalled by manufacturer.   Benzodiazepine, Ur Scrn NONE DETECTED NONE DETECTED   Methadone Scn, Ur NONE DETECTED NONE DETECTED  Chlamydia/NGC rt PCR ([facility] only)     Status: None   Collection Time: 07/22/17  3:34 PM  Result Value Ref Range   Specimen source GC/Chlam URINE, RANDOM    Chlamydia Tr NOT DETECTED NOT DETECTED   N gonorrhoeae NOT DETECTED NOT DETECTED  Prepare RBC     Status: None   Collection Time: 07/22/17  7:30 PM  Result Value Ref Range   Order Confirmation      ORDER PROCESSED BY  BLOOD BANK Performed at [facility], [address] [place], [place] [address]   CBC     Status: Abnormal   Collection Time: 07/23/17  1:39 AM  Result Value Ref Range   WBC 14.1 (H) 3.6 - 11.0 K/uL   RBC 3.32 (L) 3.80 - 5.20 MIL/uL   Hemoglobin 9.5 (L) 12.0 - 16.0 g/dL   HCT 30.828.7 (L) 65.735.0 - 84.647.0 %   MCV 86.6 80.0 - 100.0 fL   MCH 28.7 26.0 - 34.0 pg   MCHC 33.2 32.0 - 36.0 g/dL   RDW 96.214.7 (H) 95.211.5 - 84.114.5 %   Platelets 231 150 - 440 K/uL  Lactic acid, plasma     Status: Abnormal   Collection Time: 07/23/17  1:39 AM  Result Value Ref Range   Lactic Acid, Venous 2.9 (HH) 0.5 - 1.9 mmol/L  Protime-INR     Status: None   Collection Time: 07/23/17  1:39 AM  Result Value Ref Range   Prothrombin Time 12.5 11.4 - 15.2 seconds   INR 0.94   Fibrinogen     Status: None   Collection Time: 07/23/17  1:39 AM  Result Value Ref Range   Fibrinogen 444 210 - 475 mg/dL  Lipase, blood     Status: None   Collection Time: 07/23/17  1:39 AM  Result Value Ref Range   Lipase 29  11 - 51 U/L  Comprehensive metabolic panel     Status: Abnormal   Collection Time: 07/23/17  1:39 AM  Result Value Ref Range   Sodium 138 135 - 145 mmol/L   Potassium 3.5 3.5 - 5.1 mmol/L   Chloride 111 98 - 111 mmol/L   CO2 19 (L) 22 - 32 mmol/L   Glucose, Bld 141 (H) 70 - 99 mg/dL   BUN 6 6 - 20 mg/dL   Creatinine, Ser 1.61 0.44 - 1.00 mg/dL   Calcium 8.8 (L) 8.9 - 10.3 mg/dL   Total Protein 5.9 (L) 6.5 - 8.1 g/dL   Albumin 2.9 (L) 3.5 - 5.0 g/dL   AST 096 (H) 15 - 41 U/L   ALT 285 (H) 0 - 44 U/L   Alkaline Phosphatase 158 (H) 38 - 126 U/L   Total Bilirubin 0.9 0.3 - 1.2 mg/dL   GFR calc non Af Amer >60 >60 mL/min   GFR calc Af Amer >60 >60 mL/min   Anion gap 8 5 - 15  Glucose, capillary     Status: Abnormal   Collection Time: 07/23/17  2:18 AM  Result Value Ref Range   Glucose-Capillary 119 (H) 70 - 99 mg/dL    Assessment:   22 y.o. G1P0000 [redacted]w[redacted]d third trimester vaginal bleeding and abdominal  pain  Plan:   1) Abdominal pain/ vaginal bleeding - AST/ALT remain stable but mildly elevated - CBC slight WBC rise (received BMZ), slight drop in H&H but being hydrated - PT/INR and Fibrinogen normal - KB not repeated but stable on 2 check 24-hrs apart - no further bleeding on most recent trip to restroom to void - Pain almost instantly ameliorated with stadol administration, was given GI cocktail and started on famotidine - BG normal was switch to NS from D5 LR given GDM and accucheck orders placed.  No evidence of anion gap but did have slight increase in lactic acid - Only thing not evaluated thus far is imaging.  If remains stable consider MRI abdomen and pelvis  2) Fetus - cat II had brief decel noted on return to bathroom and some loss of variability following stadol administration.  Uterine tone remains normal with decreased irritability noted.    Vena Austria, MD, [facility], [facility] 07/23/2017, 3:34 AM

## 2017-07-23 NOTE — Progress Notes (Signed)
CHIEF COMPLAINT:  No chief complaint on file.  Subjective  Christine Arroyo is resting in bed comfortably. She had an episode overnight that she described as 10/10 pain in her left side. It was new and the worst pain she has ever experienced. She has had continued tenderness along her left side. She has been receiving Stadol for the pain, which is controlling the pain, but has not eliminated it.  She reports that she has been having normal bowel movements. She is tolerating a normal diet. She has not had nausea or vomiting.  She is concerned by the vaginal bleeding.  Even though it has been small in amount it is the most bleeding she has ever had. 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 She came to the hospital after she had two panty liners full of blood.   She reports that she has been having severe itching for over a month. The itching is the worst on her palms and feet, but there is itching all over her body.   She does have a history of numbness and tingling of her arms and left upper extremity that she was evaluated for two years ago. She reports that it was determined at that time that the numbness was being caused by birth control medication. She stopped taking birth control and the numbness and tingling resolved.  As part of  that work up she was found to have elevated IgM polyclonal. There is a note in the system from a consultation she had with oncology on 08/12/15 with Dr. Donneta RombergBrahmanday for elevated IgM polyclonal, no M spike which was attributed to a reactive/underlying inflammatory process.     Review of care everywhere shows that two years ago she did have normal liver enzymes.   Objective   Examination:  General exam: Appears calm and comfortable  Respiratory system: Clear to auscultation. Respiratory effort normal. HEENT: [place]/AT, PERRLA, no thrush, no stridor. Cardiovascular system: S1 & S2  heard, RRR. No JVD, murmurs, rubs, gallops or clicks. No pedal edema. Gastrointestinal system: Abdomen is nondistended, soft and nontender. No organomegaly or masses felt. Normal bowel sounds heard. Gravid uterus, tenderness along the uterine fundus.  Central nervous system: Alert and oriented. No focal neurological deficits. Extremities: Symmetric 5 x 5 power. Skin: No rashes, lesions or ulcers Psychiatry: Judgement and insight appear normal. Mood & affect appropriate.   VITALS:  height is 5\' 1"  (1.549 m) and weight is 143 lb (64.9 kg). Her oral temperature is 98 F (36.7 C). Her blood pressure is 120/73 and her pulse is 110 (abnormal). Her respiration is 16 and oxygen saturation is 97%.   I personally reviewed Labs under Results section.  Radiology Reports No results found.     Assessment/Plan:   22 yo G1P0000 at 35 0/7 1. Fetal status reassuring, category II tracing. NST: 120 bpm baseline, moderate variability, 15x15 accelerations, variable decelerations.  Tocometer :  2. Anemia- Will start ferrous sulfate and obtain anemia panel. 3. Elevated liver transaminases-  Work up has not yet shown a cause for this elevation.  Differential has considered cholestasis, HUS, TTP, viral hepatitis, acute fatty liver of pregnancy, and autoimmune hepatitis. Given her lack of corresponding coagulopathies, proteinuria, fever, or evidence of renal failure these have not yet been proven.     Cholestasis: itching present, but bile acids normal   HUS/TTP: no thrombocytopenia or acute renal failure.  Viral Hepatitis: No rash or evidence of HSV. Hepatitis A, B, C negative.  Acute Fatty Liver of Pregnancy:  No coagulopathy, no DIC, no nausea or vomiting.   Autoimmune: Will obtain labs to evaluate for this etiology.    4. Vaginal bleeding- has been small in amount, will observe closely and intervene as needed. Will repeat Kleihauer-Betke.    Code Status: Full  Family Communication: yes, husband and mother  in law present in room  DVT Prophylaxis  Lovenox - Heparin - SCDs    Time spent: over 1 hour   LOS: 0 days   Christanna R Schuman

## 2017-07-23 NOTE — Progress Notes (Signed)
Pt transferred via wheelchair by Eber Jonesarolyn to imaging for MRI. Monitors removed. IV saline locked.

## 2017-07-23 NOTE — Progress Notes (Signed)
Request for Advanced Directive education. AD education offered. Listening presence and bearing witness space of waiting for birth of son Christine Arroyo.

## 2017-07-23 NOTE — Progress Notes (Signed)
Discussed [facility]'s position with Dr. Jerene PitchSchuman to assist baby in engaging in the pelvis. MD okay to try Walcher's.

## 2017-07-24 ENCOUNTER — Inpatient Hospital Stay: Payer: BLUE CROSS/BLUE SHIELD | Admitting: Anesthesiology

## 2017-07-24 ENCOUNTER — Encounter: Payer: Self-pay | Admitting: Anesthesiology

## 2017-07-24 LAB — TYPE AND SCREEN
ABO/RH(D): O NEG
Antibody Screen: POSITIVE
UNIT DIVISION: 0
Unit division: 0

## 2017-07-24 LAB — CBC
HEMATOCRIT: 25.4 % — AB (ref 35.0–47.0)
Hemoglobin: 8.5 g/dL — ABNORMAL LOW (ref 12.0–16.0)
MCH: 29.1 pg (ref 26.0–34.0)
MCHC: 33.5 g/dL (ref 32.0–36.0)
MCV: 86.8 fL (ref 80.0–100.0)
PLATELETS: 203 10*3/uL (ref 150–440)
RBC: 2.93 MIL/uL — ABNORMAL LOW (ref 3.80–5.20)
RDW: 14.9 % — ABNORMAL HIGH (ref 11.5–14.5)
WBC: 12 10*3/uL — ABNORMAL HIGH (ref 3.6–11.0)

## 2017-07-24 LAB — COMPREHENSIVE METABOLIC PANEL
ALT: 287 U/L — AB (ref 0–44)
AST: 125 U/L — ABNORMAL HIGH (ref 15–41)
Albumin: 2.4 g/dL — ABNORMAL LOW (ref 3.5–5.0)
Alkaline Phosphatase: 140 U/L — ABNORMAL HIGH (ref 38–126)
Anion gap: 6 (ref 5–15)
BILIRUBIN TOTAL: 0.8 mg/dL (ref 0.3–1.2)
BUN: 9 mg/dL (ref 6–20)
CHLORIDE: 113 mmol/L — AB (ref 98–111)
CO2: 21 mmol/L — ABNORMAL LOW (ref 22–32)
Calcium: 8.1 mg/dL — ABNORMAL LOW (ref 8.9–10.3)
Creatinine, Ser: 0.68 mg/dL (ref 0.44–1.00)
Glucose, Bld: 75 mg/dL (ref 70–99)
Potassium: 3.3 mmol/L — ABNORMAL LOW (ref 3.5–5.1)
Sodium: 140 mmol/L (ref 135–145)
TOTAL PROTEIN: 5.1 g/dL — AB (ref 6.5–8.1)

## 2017-07-24 LAB — GLUCOSE, CAPILLARY
GLUCOSE-CAPILLARY: 76 mg/dL (ref 70–99)
GLUCOSE-CAPILLARY: 85 mg/dL (ref 70–99)
GLUCOSE-CAPILLARY: 77 mg/dL (ref 70–99)
GLUCOSE-CAPILLARY: 73 mg/dL (ref 70–99)

## 2017-07-24 LAB — BPAM RBC
BLOOD PRODUCT EXPIRATION DATE: 201907152359
Blood Product Expiration Date: 201907142359
UNIT TYPE AND RH: 9500
Unit Type and Rh: 9500

## 2017-07-24 LAB — PREPARE RBC (CROSSMATCH)

## 2017-07-24 LAB — RAPID HIV SCREEN (HIV 1/2 AB+AG)
HIV 1/2 Antibodies: NONREACTIVE
HIV-1 P24 Antigen - HIV24: NONREACTIVE

## 2017-07-24 MED ORDER — LIDOCAINE HCL (PF) 1 % IJ SOLN
INTRAMUSCULAR | Status: AC
  Filled 2017-07-24: qty 30

## 2017-07-24 MED ORDER — EPHEDRINE 5 MG/ML INJ
10.0000 mg | INTRAVENOUS | Status: DC | PRN
  Filled 2017-07-24: qty 2

## 2017-07-24 MED ORDER — AMMONIA AROMATIC IN INHA
RESPIRATORY_TRACT | Status: AC
  Filled 2017-07-24: qty 10

## 2017-07-24 MED ORDER — LIDOCAINE HCL (PF) 1 % IJ SOLN: 30.0000 mL | INTRAMUSCULAR | Status: DC | PRN

## 2017-07-24 MED ORDER — OXYTOCIN 40 UNITS IN LACTATED RINGERS INFUSION - SIMPLE MED
1.0000 m[IU]/min | INTRAVENOUS | Status: DC
  Administered 2017-07-24: 2 m[IU]/min via INTRAVENOUS

## 2017-07-24 MED ORDER — VANCOMYCIN HCL IN DEXTROSE 1-5 GM/200ML-% IV SOLN: 1000.0000 mg | Freq: Two times a day (BID) | INTRAVENOUS | Status: DC

## 2017-07-24 MED ORDER — OXYTOCIN BOLUS FROM INFUSION: 500.0000 mL | Freq: Once | INTRAVENOUS | Status: DC

## 2017-07-24 MED ORDER — LACTATED RINGERS IV SOLN: 500.0000 mL | Freq: Once | INTRAVENOUS | Status: DC

## 2017-07-24 MED ORDER — BUTORPHANOL TARTRATE 2 MG/ML IJ SOLN
0.5000 mg | Freq: Once | INTRAMUSCULAR | Status: AC
  Administered 2017-07-24: 0.5 mg via INTRAVENOUS

## 2017-07-24 MED ORDER — OXYTOCIN 40 UNITS IN LACTATED RINGERS INFUSION - SIMPLE MED
2.5000 [IU]/h | INTRAVENOUS | Status: DC
  Filled 2017-07-24: qty 1000

## 2017-07-24 MED ORDER — FENTANYL 2.5 MCG/ML W/ROPIVACAINE 0.15% IN NS 100 ML EPIDURAL (ARMC)
EPIDURAL | Status: AC
  Filled 2017-07-24: qty 100

## 2017-07-24 MED ORDER — SODIUM CHLORIDE 0.9 % IV SOLN
INTRAVENOUS | Status: DC
Start: 2017-07-24 — End: 2017-07-25

## 2017-07-24 MED ORDER — FENTANYL 2.5 MCG/ML W/ROPIVACAINE 0.15% IN NS 100 ML EPIDURAL (ARMC)
12.0000 mL/h | EPIDURAL | Status: DC
  Administered 2017-07-24: 12 mL/h via EPIDURAL

## 2017-07-24 MED ORDER — SOD CITRATE-CITRIC ACID 500-334 MG/5ML PO SOLN: 30.0000 mL | ORAL | Status: DC | PRN

## 2017-07-24 MED ORDER — TERBUTALINE SULFATE 1 MG/ML IJ SOLN: 0.2500 mg | Freq: Once | INTRAMUSCULAR | Status: DC | PRN

## 2017-07-24 MED ORDER — LACTATED RINGERS IV SOLN: INTRAVENOUS | Status: DC

## 2017-07-24 MED ORDER — ONDANSETRON HCL 4 MG/2ML IJ SOLN
4.0000 mg | Freq: Four times a day (QID) | INTRAMUSCULAR | Status: DC | PRN
  Administered 2017-07-24: 4 mg via INTRAVENOUS
  Filled 2017-07-24: qty 2

## 2017-07-24 MED ORDER — OXYTOCIN 40 UNITS IN LACTATED RINGERS INFUSION - SIMPLE MED
INTRAVENOUS | Status: AC
  Filled 2017-07-24: qty 1000

## 2017-07-24 MED ORDER — BUPIVACAINE HCL (PF) 0.25 % IJ SOLN
INTRAMUSCULAR | Status: DC | PRN
  Administered 2017-07-24 (×2): 5 mL via EPIDURAL

## 2017-07-24 MED ORDER — FENTANYL 2.5 MCG/ML W/ROPIVACAINE 0.15% IN NS 100 ML EPIDURAL (ARMC)
EPIDURAL | Status: DC | PRN
  Administered 2017-07-24: 12 mL/h via EPIDURAL

## 2017-07-24 MED ORDER — MISOPROSTOL 200 MCG PO TABS
ORAL_TABLET | ORAL | Status: AC
  Filled 2017-07-24: qty 4

## 2017-07-24 MED ORDER — OXYTOCIN 10 UNIT/ML IJ SOLN
INTRAMUSCULAR | Status: AC
  Filled 2017-07-24: qty 2

## 2017-07-24 MED ORDER — PHENYLEPHRINE 40 MCG/ML (10ML) SYRINGE FOR IV PUSH (FOR BLOOD PRESSURE SUPPORT)
80.0000 ug | PREFILLED_SYRINGE | INTRAVENOUS | Status: DC | PRN
  Filled 2017-07-24: qty 5

## 2017-07-24 MED ORDER — LACTATED RINGERS IV SOLN: 500.0000 mL | INTRAVENOUS | Status: DC | PRN

## 2017-07-24 MED ORDER — DIPHENHYDRAMINE HCL 50 MG/ML IJ SOLN: 12.5000 mg | INTRAMUSCULAR | Status: DC | PRN

## 2017-07-24 MED ORDER — LIDOCAINE HCL (PF) 1 % IJ SOLN
INTRAMUSCULAR | Status: DC | PRN
  Administered 2017-07-24: 2 mL via SUBCUTANEOUS

## 2017-07-24 MED ORDER — LIDOCAINE-EPINEPHRINE (PF) 1.5 %-1:200000 IJ SOLN
INTRAMUSCULAR | Status: DC | PRN
  Administered 2017-07-24: 3 mL via EPIDURAL

## 2017-07-24 NOTE — Progress Notes (Addendum)
Patient ID: Christine Arroyo, female   DOB: 1995-11-22, 22 y.o.   MRN: [id number]   Subjective  Patient reports that this morning at 5:45 she had a gush of clear fluid. She did not initially report this to nursing until several hours later. The fluid is clear. No vaginal bleeding. Good fetal movement. No fever.  She has been wearing a maxi pad to absorb the fluid.   Objective   Examination:  General exam: Appears calm and comfortable  Respiratory system: Clear to auscultation. Respiratory effort normal. HEENT: [place]/AT, PERRLA, no thrush, no stridor. Cardiovascular system: S1 & S2 heard, RRR. No JVD, murmurs, rubs, gallops or clicks. No pedal edema. Gastrointestinal system: Abdomen is nondistended, soft and nontender. No organomegaly or masses felt. Normal bowel sounds heard. Central nervous system: Alert and oriented. No focal neurological deficits. Extremities: Symmetric 5 x 5 power. Skin: No rashes, lesions or ulcers Psychiatry: Judgement and insight appear normal. Mood & affect appropriate.  Pelvic: Pooling of clear fluid in vaginal vault.  SVE 1/70/-3   VITALS:  height is 5\' 1"  (1.549 m) and weight is 143 lb (64.9 kg). Her oral temperature is 98.5 F (36.9 C). Her blood pressure is 118/63 and her pulse is 77. Her respiration is 16 and oxygen saturation is 97%.   I personally reviewed Labs under Results section.  NST: 140 bpm baseline, moderate variability, 15x15  accelerations, no decelerations. Tocometer : quiet   Radiology Reports Mr Pelvis Wo Contrast  Result Date: 07/23/2017 CLINICAL DATA:  Abdominal pain. Vaginal bleeding. Third trimester pregnancy. EXAM: MRI ABDOMEN AND PELVIS WITHOUT CONTRAST TECHNIQUE: Multiplanar multisequence MR imaging of the abdomen and pelvis was performed. No intravenous contrast was administered. COMPARISON:  Pelvic ultrasound from 02/15/2015. Abdominal ultrasound from 07/23/2017 FINDINGS: COMBINED FINDINGS FOR BOTH MR ABDOMEN AND PELVIS Lower chest:  Unremarkable where included. Hepatobiliary: The gallbladder appears unremarkable. No biliary dilatation. On ultrasound there is a focal hyperechoic lesion near the dome of the right hepatic lobe; a corresponding lesion is not well seen on today's MRI, although portions of the liver in particular the dome are excluded due to field of view issues for including the pelvis as well. Pancreas:  Unremarkable Spleen:  Unremarkable Adrenals/Urinary Tract: Adrenal glands normal. There is moderate to prominent right mild left hydronephrosis, with hydroureter extending down to the iliac vessel cross over level for the gravid uterus overlies the crossing ureter. The distal ureters do not appear dilated, and the urinary bladder appears normal. Stomach/Bowel: A variety of tubular structures below the cecum are most compatible with parametrial vasculature. I do not confidently identified the appendix. However, I also do not see an obvious inflamed tubular structure in the right lower quadrant. Vascular/Lymphatic:  Unremarkable Reproductive: Gravid uterus noted, single pregnancy in cephalic presentation. Primarily posterior placenta without compelling findings of abruption. A small portion of the placenta extends around the left side to the left anterior margin. No previa. Today's exam was not protocol for fetal evaluation. The cervix is closed but accurate cervical length is problematic due to orientation. Other:  No supplemental non-categorized findings. Musculoskeletal: There is a small amount of fluid signal interposed between the right external oblique and internal oblique muscles, raising the possibility of a muscle strain or local bursitis. IMPRESSION: 1. Prominent right and mild left hydronephrosis and hydroureter extending down to the level of the iliac vessel cross over, where the gravid uterus may be exerting mass effect on the ureters particularly in the prone position. No distal ureteral dilatation or bladder  dilatation. 2. Non identification of the appendix. However, I do not see an inflammatory process in the right lower quadrant. 3. Fluid signal intensity tracking between the right external and internal oblique musculature, possibly reflecting a muscle strain or small amount of local bursitis along the right anterior abdominal wall musculature. 4. No correlate for the apparent hyperechoic lesion in the upper liver is identified on today's MRI, although portions of the dome of the liver are excluded and motion artifact in this area may reduce sensitivity. I would suggest repeat ultrasound postpartum to reassess the liver. Electronically Signed   By: Gaylyn RongWalter  Liebkemann M.D.   On: 07/23/2017 16:56   Mr Abdomen Wo Contrast  Result Date: 07/23/2017 CLINICAL DATA:  Abdominal pain. Vaginal bleeding. Third trimester pregnancy. EXAM: MRI ABDOMEN AND PELVIS WITHOUT CONTRAST TECHNIQUE: Multiplanar multisequence MR imaging of the abdomen and pelvis was performed. No intravenous contrast was administered. COMPARISON:  Pelvic ultrasound from 02/15/2015. Abdominal ultrasound from 07/23/2017 FINDINGS: COMBINED FINDINGS FOR BOTH MR ABDOMEN AND PELVIS Lower chest: Unremarkable where included. Hepatobiliary: The gallbladder appears unremarkable. No biliary dilatation. On ultrasound there is a focal hyperechoic lesion near the dome of the right hepatic lobe; a corresponding lesion is not well seen on today's MRI, although portions of the liver in particular the dome are excluded due to field of view issues for including the pelvis as well. Pancreas:  Unremarkable Spleen:  Unremarkable Adrenals/Urinary Tract: Adrenal glands normal. There is moderate to prominent right mild left hydronephrosis, with hydroureter extending down to the iliac vessel cross over level for the gravid uterus overlies the crossing ureter. The distal ureters do not appear dilated, and the urinary bladder appears normal. Stomach/Bowel: A variety of tubular  structures below the cecum are most compatible with parametrial vasculature. I do not confidently identified the appendix. However, I also do not see an obvious inflamed tubular structure in the right lower quadrant. Vascular/Lymphatic:  Unremarkable Reproductive: Gravid uterus noted, single pregnancy in cephalic presentation. Primarily posterior placenta without compelling findings of abruption. A small portion of the placenta extends around the left side to the left anterior margin. No previa. Today's exam was not protocol for fetal evaluation. The cervix is closed but accurate cervical length is problematic due to orientation. Other:  No supplemental non-categorized findings. Musculoskeletal: There is a small amount of fluid signal interposed between the right external oblique and internal oblique muscles, raising the possibility of a muscle strain or local bursitis. IMPRESSION: 1. Prominent right and mild left hydronephrosis and hydroureter extending down to the level of the iliac vessel cross over, where the gravid uterus may be exerting mass effect on the ureters particularly in the prone position. No distal ureteral dilatation or bladder dilatation. 2. Non identification of the appendix. However, I do not see an inflammatory process in the right lower quadrant. 3. Fluid signal intensity tracking between the right external and internal oblique musculature, possibly reflecting a muscle strain or small amount of local bursitis along the right anterior abdominal wall musculature. 4. No correlate for the apparent hyperechoic lesion in the upper liver is identified on today's MRI, although portions of the dome of the liver are excluded and motion artifact in this area may reduce sensitivity. I would suggest repeat ultrasound postpartum to reassess the liver. Electronically Signed   By: Gaylyn RongWalter  Liebkemann M.D.   On: 07/23/2017 16:56   [place] Abdomen Limited Ruq  Result Date: 07/23/2017 CLINICAL DATA:  Third  trimester pregnancy, elevated liver enzymes EXAM: ULTRASOUND ABDOMEN LIMITED  RIGHT UPPER QUADRANT COMPARISON:  None. FINDINGS: Gallbladder: No gallstones or wall thickening visualized. No sonographic Murphy sign noted by sonographer. Common bile duct: Diameter: 3 mm Liver: Echogenic lesion in in the dome of the right hepatic lobe, with suspicion for potential shadowing on images 43-44. portal vein is patent on color Doppler imaging with normal direction of blood flow towards the liver. Incidental note is made of prominent right hydronephrosis. IMPRESSION: 1. Prominent right hydronephrosis. 2. No findings of biliary dilatation or gallstones. 3. Small echogenic and possibly shadowing lesion near the dome of the liver. This could represent a calcification from old granulomatous disease. The finding was not well correlated on the recent MRI. If clinically warranted, for example by persistence of elevated liver enzymes, repeat ultrasound could be performed postpartum to reassess this lesion. Electronically Signed   By: Gaylyn Rong M.D.   On: 07/23/2017 16:59       Assessment/Plan:  22 yo G1P0000 [redacted]w[redacted]d 1. PPROM- Nitrazine and ferning positive, pooling seen in the vagina. Will induce with cooks catheter and pitocin. GBS unknown. GBS negative. 2. Anemia- patient has not had a source of bleeding that has been observed. Ferritin is very low, postpartum she can bee referred to hematology for IV iron therapy.  3. Elevated liver transaminases- stable but remain elevated. RUQ US showed a 0.99 x1.4cm echogenic mass which will need to be followed after delivery. Labs pending for autoimmune source.   4. Vaginal bleeding- none observed in 24 hours. None seen on speculum exam.  5. Gestational diabetic- diet controlled, will check every 2 hours in early labor every hour in active labor.   Adelene Idler MD [facility], [facility] 07/24/17 10:08 AM

## 2017-07-24 NOTE — Progress Notes (Signed)
Patient ID: Christine Arroyo, female   DOB: 10/12/1995, 22 y.o.   MRN: [id number]  Cooks catheter placed inflated with 70cc in the uterine balloon and 80 cc in the vaginal balloon.  Patient tolerated procedure well. Adelene Idlerhristanna Latona Krichbaum MD [facility], [facility] 07/24/17 10:20 AM

## 2017-07-24 NOTE — Anesthesia Preprocedure Evaluation (Addendum)
Anesthesia Evaluation  Patient identified by MRN, date of birth, ID band Patient awake    Reviewed: Allergy & Precautions, H&P , NPO status , Patient's Chart, lab work & pertinent test results, reviewed documented beta blocker date and time   History of Anesthesia Complications Negative for: history of anesthetic complications  Airway Mallampati: II  TM Distance: >3 FB Neck ROM: full    Dental no notable dental hx.    Pulmonary neg shortness of breath, asthma , neg recent URI,           Cardiovascular Exercise Tolerance: Good negative cardio ROS       Neuro/Psych  Headaches, neg Seizures negative psych ROS   GI/Hepatic Neg liver ROS, GERD  ,  Endo/Other  diabetes  Renal/GU negative Renal ROS  negative genitourinary   Musculoskeletal   Abdominal   Peds  Hematology negative hematology ROS (+)   Anesthesia Other Findings Past Medical History: No date: Gestational diabetes No date: Migraines No date: Numbness and tingling of both legs No date: PCOS (polycystic ovarian syndrome) No date: Poor circulation of extremity   Reproductive/Obstetrics (+) Pregnancy                            Anesthesia Physical Anesthesia Plan  ASA: III  Anesthesia Plan: Epidural   Post-op Pain Management:    Induction:   PONV Risk Score and Plan:   Airway Management Planned:   Additional Equipment:   Intra-op Plan:   Post-operative Plan:   Informed Consent: I have reviewed the patients History and Physical, chart, labs and discussed the procedure including the risks, benefits and alternatives for the proposed anesthesia with the patient or authorized representative who has indicated his/her understanding and acceptance.   Dental Advisory Given  Plan Discussed with: Anesthesiologist, CRNA and Surgeon  Anesthesia Plan Comments:        Anesthesia Quick Evaluation

## 2017-07-24 NOTE — Progress Notes (Signed)
1740 Walcher's position x3 contractions with second RN and family to assist in spotting. Pt and baby tolerated well.

## 2017-07-24 NOTE — Anesthesia Procedure Notes (Signed)
Epidural Patient location during procedure: OB Start time: 07/24/2017 1:32 PM End time: 07/24/2017 1:40 PM  Staffing Anesthesiologist: Lenard SimmerKarenz, Dakarri Kessinger, MD Performed: anesthesiologist   Preanesthetic Checklist Completed: patient identified, site marked, surgical consent, pre-op evaluation, timeout performed, IV checked, risks and benefits discussed and monitors and equipment checked  Epidural Patient position: sitting Prep: ChloraPrep Patient monitoring: heart rate, continuous pulse ox and blood pressure Approach: midline Location: L3-L4 Injection technique: LOR saline  Needle:  Needle type: Tuohy  Needle gauge: 17 G Needle length: 9 cm and 9 Needle insertion depth: 3.5 cm Catheter type: closed end flexible Catheter size: 19 Gauge Catheter at skin depth: 8 cm Test dose: negative and 1.5% lidocaine with Epi 1:200 K  Assessment Sensory level: T10 Events: blood not aspirated, injection not painful, no injection resistance, negative IV test and no paresthesia  Additional Notes 1st attempt Pt. Evaluated and documentation done after procedure finished. Patient identified. Risks/Benefits/Options discussed with patient including but not limited to bleeding, infection, nerve damage, paralysis, failed block, incomplete pain control, headache, blood pressure changes, nausea, vomiting, reactions to medication both or allergic, itching and postpartum back pain. Confirmed with bedside nurse the patient's most recent platelet count. Confirmed with patient that they are not currently taking any anticoagulation, have any bleeding history or any family history of bleeding disorders. Patient expressed understanding and wished to proceed. All questions were answered. Sterile technique was used throughout the entire procedure. Please see nursing notes for vital signs. Test dose was given through epidural catheter and negative prior to continuing to dose epidural or start infusion. Warning signs of high  block given to the patient including shortness of breath, tingling/numbness in hands, complete motor block, or any concerning symptoms with instructions to call for help. Patient was given instructions on fall risk and not to get out of bed. All questions and concerns addressed with instructions to call with any issues or inadequate analgesia.   Patient tolerated the insertion well without immediate complications.Reason for block:procedure for pain

## 2017-07-24 NOTE — Progress Notes (Signed)
SVE stretchy 7cm / 70% /-3 NST: 150 bpm baseline, moderate variability, 15x15 accelerations, one deceleration 120 bpm after placement of the IUPC which lasted for 2 minutes with a good return to baseline. Tocometer : every 2 minutes Will continue with IV pitocin Adelene Idlerhristanna Isaiah Cianci MD [facility], [facility] 07/24/17 6:25 PM

## 2017-07-25 DIAGNOSIS — O24410 Gestational diabetes mellitus in pregnancy, diet controlled: Secondary | ICD-10-CM

## 2017-07-25 DIAGNOSIS — O360130 Maternal care for anti-D [Rh] antibodies, third trimester, not applicable or unspecified: Secondary | ICD-10-CM

## 2017-07-25 DIAGNOSIS — Z3A34 34 weeks gestation of pregnancy: Secondary | ICD-10-CM

## 2017-07-25 LAB — COMPREHENSIVE METABOLIC PANEL
ALK PHOS: 152 U/L — AB (ref 38–126)
ALT: 285 U/L — ABNORMAL HIGH (ref 0–44)
ANION GAP: 5 (ref 5–15)
AST: 129 U/L — ABNORMAL HIGH (ref 15–41)
Albumin: 2.3 g/dL — ABNORMAL LOW (ref 3.5–5.0)
BILIRUBIN TOTAL: 1.4 mg/dL — AB (ref 0.3–1.2)
BUN: 6 mg/dL (ref 6–20)
CALCIUM: 8.3 mg/dL — AB (ref 8.9–10.3)
CO2: 22 mmol/L (ref 22–32)
Chloride: 108 mmol/L (ref 98–111)
Creatinine, Ser: 0.5 mg/dL (ref 0.44–1.00)
Glucose, Bld: 80 mg/dL (ref 70–99)
Potassium: 3.3 mmol/L — ABNORMAL LOW (ref 3.5–5.1)
SODIUM: 135 mmol/L (ref 135–145)
TOTAL PROTEIN: 5 g/dL — AB (ref 6.5–8.1)

## 2017-07-25 LAB — RPR: RPR Ser Ql: NONREACTIVE

## 2017-07-25 LAB — CBC
HCT: 30.8 % — ABNORMAL LOW (ref 35.0–47.0)
HEMOGLOBIN: 10.3 g/dL — AB (ref 12.0–16.0)
MCH: 28.3 pg (ref 26.0–34.0)
MCHC: 33.4 g/dL (ref 32.0–36.0)
MCV: 84.8 fL (ref 80.0–100.0)
Platelets: 201 10*3/uL (ref 150–440)
RBC: 3.63 MIL/uL — ABNORMAL LOW (ref 3.80–5.20)
RDW: 14.9 % — ABNORMAL HIGH (ref 11.5–14.5)
WBC: 21.5 10*3/uL — ABNORMAL HIGH (ref 3.6–11.0)

## 2017-07-25 LAB — PATHOLOGIST SMEAR REVIEW

## 2017-07-25 MED ORDER — WITCH HAZEL-GLYCERIN EX PADS: 1.0000 "application " | MEDICATED_PAD | CUTANEOUS | Status: DC | PRN

## 2017-07-25 MED ORDER — ACETAMINOPHEN 325 MG PO TABS
650.0000 mg | ORAL_TABLET | ORAL | Status: DC | PRN
  Administered 2017-07-25 (×4): 650 mg via ORAL
  Filled 2017-07-25 (×4): qty 2

## 2017-07-25 MED ORDER — ZOLPIDEM TARTRATE 5 MG PO TABS: 5.0000 mg | ORAL_TABLET | Freq: Every evening | ORAL | Status: DC | PRN

## 2017-07-25 MED ORDER — DOCUSATE SODIUM 100 MG PO CAPS: 100.0000 mg | ORAL_CAPSULE | Freq: Two times a day (BID) | ORAL | Status: DC

## 2017-07-25 MED ORDER — FERROUS SULFATE 325 (65 FE) MG PO TABS
325.0000 mg | ORAL_TABLET | Freq: Two times a day (BID) | ORAL | Status: DC
  Administered 2017-07-25 – 2017-07-26 (×3): 325 mg via ORAL
  Filled 2017-07-25 (×3): qty 1

## 2017-07-25 MED ORDER — PRENATAL MULTIVITAMIN CH: 1.0000 | ORAL_TABLET | Freq: Every day | ORAL | Status: DC

## 2017-07-25 MED ORDER — DOCUSATE SODIUM 100 MG PO CAPS
100.0000 mg | ORAL_CAPSULE | Freq: Two times a day (BID) | ORAL | Status: DC | PRN
  Administered 2017-07-25: 100 mg via ORAL
  Filled 2017-07-25: qty 1

## 2017-07-25 MED ORDER — IBUPROFEN 600 MG PO TABS
600.0000 mg | ORAL_TABLET | Freq: Four times a day (QID) | ORAL | Status: DC
  Administered 2017-07-25 – 2017-07-26 (×5): 600 mg via ORAL
  Filled 2017-07-25 (×5): qty 1

## 2017-07-25 MED ORDER — COCONUT OIL OIL
1.0000 "application " | TOPICAL_OIL | Status: DC | PRN
  Filled 2017-07-25: qty 120

## 2017-07-25 MED ORDER — SIMETHICONE 80 MG PO CHEW: 80.0000 mg | CHEWABLE_TABLET | ORAL | Status: DC | PRN

## 2017-07-25 MED ORDER — ONDANSETRON HCL 4 MG/2ML IJ SOLN: 4.0000 mg | INTRAMUSCULAR | Status: DC | PRN

## 2017-07-25 MED ORDER — BENZOCAINE-MENTHOL 20-0.5 % EX AERO
1.0000 "application " | INHALATION_SPRAY | CUTANEOUS | Status: DC | PRN
  Administered 2017-07-25: 1 via TOPICAL
  Filled 2017-07-25: qty 56

## 2017-07-25 MED ORDER — DIPHENHYDRAMINE HCL 25 MG PO CAPS: 25.0000 mg | ORAL_CAPSULE | Freq: Four times a day (QID) | ORAL | Status: DC | PRN

## 2017-07-25 MED ORDER — VARICELLA VIRUS VACCINE LIVE 1350 PFU/0.5ML IJ SUSR
0.5000 mL | Freq: Once | INTRAMUSCULAR | Status: DC
  Filled 2017-07-25: qty 0.5

## 2017-07-25 MED ORDER — ONDANSETRON HCL 4 MG PO TABS: 4.0000 mg | ORAL_TABLET | ORAL | Status: DC | PRN

## 2017-07-25 MED ORDER — DIBUCAINE 1 % RE OINT: 1.0000 "application " | TOPICAL_OINTMENT | RECTAL | Status: DC | PRN

## 2017-07-25 NOTE — Lactation Note (Signed)
This note was copied from a baby's chart. Lactation Consultation Note  Patient Name: Christine Timoteo GaulVictoria Chambers ZHYQM'VToday's Date: 07/25/2017 Reason for consult: Follow-up assessment;Late-preterm 34-36.6wks [name] finally waking up after circumcision and rooting.  He latched with minimal assistance with good rhythmic sucking with frequent pauses.  Mom reports no biting with this feeding.  Encouraged to put back to breast with any feeding cues and not to go longer than 3 hours without arousing for feeding.    Maternal Data Formula Feeding for Exclusion: No Has patient been taught Hand Expression?: Yes(Can easily hand express colostrum) Does the patient have breastfeeding experience prior to this delivery?: No  Feeding Feeding Type: Breast Fed Length of feed: 24 min  LATCH Score Latch: Repeated attempts needed to sustain latch, nipple held in mouth throughout feeding, stimulation needed to elicit sucking reflex.  Audible Swallowing: A few with stimulation  Type of Nipple: Everted at rest and after stimulation  Comfort (Breast/Nipple): Soft / non-tender  Hold (Positioning): Assistance needed to correctly position infant at breast and maintain latch.  LATCH Score: 7  Interventions Interventions: Assisted with latch;Breast compression;Coconut oil;Support pillows;Comfort gels  Lactation Tools Discussed/Used Tools: Coconut oil;Comfort gels WIC Program: No(BCBS)   Consult Status Consult Status: PRN Follow-up type: Call as needed    Christine Arroyo, Christine Arroyo 07/25/2017, 4:13 PM

## 2017-07-25 NOTE — Plan of Care (Signed)
Patient's temp 99.6 0rally and BP154/86; Dr.Shuman aware; IV fluids continue; IV site clear; pain controlled with po tylenol and motrin po will start at 0600; tolerating regular diet; good po fluids; voided since delivery; fundus firm; scant rubra lochia; breastfeeding with good technique; good maternal-infant bonding observed; husband at bedside and attentive.

## 2017-07-25 NOTE — Lactation Note (Signed)
This note was copied from a baby's chart. Lactation Consultation Note  Patient Name: Christine Arroyo ZOXWR'UToday's Date: 07/25/2017 Reason for consult: Initial assessment;Primapara;Late-preterm 34-36.6wks;Nipple pain/trauma;Mother's request;Difficult latch;Other (Comment)(Sleepy after circumcision) [name] was lethargic after circumcision and not interested in breast feeding.  Attempted in cross cradle hold initially without success.  Demonstrated waking techniques.  Positioned [name] in football hold with pillow support which seemed to work better.  Demonstrated how to hand express to entice [name] to latch.  Finally got [name] to latch to take a few strong sucks with mom reporting feeling strong tugs at the breast.  Mom complaining that he was biting after a couple of minutes.  Repositioned him but could not get him to continue to suck.  Massaged breasts and hand expressed 3 ml colostrum which was given via curved tip syringe.  Coconut oil and comfort gels given for sore nipples and reviewed how to alternate in use.  Discussed supply and demand and need to continue to take milk from breast preferrably by breast feeding to continue to make milk, bring in mature milk and ensure a plentiful milk supply.  Reviewed normal course of lactation and routine newborn feeding patterns explaining how it is normal for newborns to be sleepy after circumcision.  Lactation name and number written on white board and encouraged to call with questions, concerns or assistance.  Maternal Data Formula Feeding for Exclusion: No Has patient been taught Hand Expression?: Yes(Can easily hand express colostrum) Does the patient have breastfeeding experience prior to this delivery?: No  Feeding Feeding Type: Breast Fed Length of feed: 2 min(Continually massaging br & HE colostrum in mouth)  LATCH Score Latch: Repeated attempts needed to sustain latch, nipple held in mouth throughout feeding, stimulation needed to elicit sucking  reflex.  Audible Swallowing: A few with stimulation  Type of Nipple: Everted at rest and after stimulation  Comfort (Breast/Nipple): Filling, red/small blisters or bruises, mild/mod discomfort  Hold (Positioning): Assistance needed to correctly position infant at breast and maintain latch.  LATCH Score: 6  Interventions Interventions: Breast feeding basics reviewed;Position options;Assisted with latch;Reverse pressure;Hand express;Skin to skin;Breast compression;Coconut oil;Breast massage;Adjust position;Support pillows;Comfort gels  Lactation Tools Discussed/Used Tools: Coconut oil;Comfort gels;Other (comment)(hand expressed & gave 3 ml via curved tip syringe) WIC Program: No(BCBS Insurance) Pump Review: (Discussed how to get [employer] through [employer])   Consult Status Consult Status: Follow-up Date: 07/25/17 Follow-up type: Call as needed    Louis MeckelWilliams, Louis Gaw Kay 07/25/2017, 12:36 PM

## 2017-07-25 NOTE — Progress Notes (Signed)
PPD#0 SVD Subjective:  Up to bathroom, well-appearing, performing self-care independently. Pain control is good. Voiding without difficulty. Tolerating a regular diet. Ambulating well.  Objective:   Blood pressure 133/83, pulse 62, temperature 98.4 F (36.9 C), temperature source Oral, resp. rate 18, height _0  (1.549 m), weight 143 lb (64.9 kg), last menstrual period 11/15/2016, SpO2 97 %, currently breastfeeding.  General: NAD Pulmonary: no increased work of breathing Abdomen: non-distended, non-tender Uterus:  fundus firm; lochia appropraite Laceration: N/A Extremities: no edema, no erythema, no tenderness, no signs of DVT  Results for orders placed or performed during the hospital encounter of 07/21/17 (from the past 72 hour(s))  Kleihauer-Betke stain     Status: None   Collection Time: 07/22/17  1:50 PM  Result Value Ref Range   Fetal Cells % 0 %   Quantitation Fetal Hemoglobin 0.0000 mL   # Vials RhIg 1     Comment: Performed at [facility], [address] [place], [place] [address]  Urine Drug Screen, Qualitative ([place] only)     Status: Abnormal   Collection Time: 07/22/17  3:34 PM  Result Value Ref Range   Tricyclic, Ur Screen NONE DETECTED NONE DETECTED   Amphetamines, Ur Screen NONE DETECTED NONE DETECTED   MDMA (Ecstasy)Ur Screen NONE DETECTED NONE DETECTED   Cocaine Metabolite,Ur [place] NONE DETECTED NONE DETECTED   Opiate, Ur Screen NONE DETECTED NONE DETECTED   Phencyclidine (PCP) Ur S NONE DETECTED NONE DETECTED   Cannabinoid 50 Ng, Ur [place] NONE DETECTED NONE DETECTED   Barbiturates, Ur Screen (A) NONE DETECTED    Result not available. Reagent lot number recalled by manufacturer.   Benzodiazepine, Ur Scrn NONE DETECTED NONE DETECTED   Methadone Scn, Ur NONE DETECTED NONE DETECTED    Comment: (NOTE) Tricyclics + metabolites, urine    Cutoff 1000 ng/mL Amphetamines + metabolites, urine  Cutoff 1000 ng/mL MDMA (Ecstasy), urine              Cutoff 500  ng/mL Cocaine Metabolite, urine          Cutoff 300 ng/mL Opiate + metabolites, urine        Cutoff 300 ng/mL Phencyclidine (PCP), urine         Cutoff 25 ng/mL Cannabinoid, urine                 Cutoff 50 ng/mL Barbiturates + metabolites, urine  Cutoff 200 ng/mL Benzodiazepine, urine              Cutoff 200 ng/mL Methadone, urine                   Cutoff 300 ng/mL The urine drug screen provides only a preliminary, unconfirmed analytical test result and should not be used for non-medical purposes. Clinical consideration and professional judgment should be applied to any positive drug screen result due to possible interfering substances. A more specific alternate chemical method must be used in order to obtain a confirmed analytical result. Gas chromatography / mass spectrometry (GC/MS) is the preferred confirmat ory method. Performed at [facility], [address] [place], [place] [address]   [place] rt PCR [facility] only)     Status: None   Collection Time: 07/22/17  3:34 PM  Result Value Ref Range   Specimen source GC/Chlam URINE, RANDOM    Chlamydia Tr NOT DETECTED NOT DETECTED   N gonorrhoeae NOT DETECTED NOT DETECTED    Comment: (NOTE) 100  This methodology has not been evaluated  in pregnant women or in 200  patients with a history of hysterectomy. 300 400  This methodology will not be performed on patients less than 1  years of age. Performed at [facility], [address] [place], [place] [address]   Prepare RBC     Status: None   Collection Time: 07/22/17  7:30 PM  Result Value Ref Range   Order Confirmation      ORDER PROCESSED BY BLOOD BANK Performed at [facility], [address] [place], [place] [address]   CBC     Status: Abnormal   Collection Time: 07/23/17  1:39 AM  Result Value Ref Range   WBC 14.1 (H) 3.6 - 11.0 K/uL   RBC 3.32 (L) 3.80 - 5.20 MIL/uL   Hemoglobin 9.5 (L) 12.0 - 16.0 g/dL   HCT 28.7 (L) 35.0 - 47.0  %   MCV 86.6 80.0 - 100.0 fL   MCH 28.7 26.0 - 34.0 pg   MCHC 33.2 32.0 - 36.0 g/dL   RDW 14.7 (H) 11.5 - 14.5 %   Platelets 231 150 - 440 K/uL    Comment: Performed at [facility], [address] [place], [place] [address]  Lactic acid, plasma     Status: Abnormal   Collection Time: 07/23/17  1:39 AM  Result Value Ref Range   Lactic Acid, Venous 2.9 (HH) 0.5 - 1.9 mmol/L    Comment: CRITICAL RESULT CALLED TO, READ BACK BY AND VERIFIED WITH KRISTEN CEVALLOS AT 0211 07/23/17.PMH Performed at [facility], [address] [place], [place] [address]   Protime-INR     Status: None   Collection Time: 07/23/17  1:39 AM  Result Value Ref Range   Prothrombin Time 12.5 11.4 - 15.2 seconds   INR 0.94     Comment: Performed at [facility], [address] [place], [place] [address]  Fibrinogen     Status: None   Collection Time: 07/23/17  1:39 AM  Result Value Ref Range   Fibrinogen 444 210 - 475 mg/dL    Comment: Performed at [facility], [address] [place], [place] [address]  Lipase, blood     Status: None   Collection Time: 07/23/17  1:39 AM  Result Value Ref Range   Lipase 29 11 - 51 U/L    Comment: Performed at [facility], [address] [place],  [address]  Comprehensive metabolic panel     Status: Abnormal   Collection Time: 07/23/17  1:39 AM  Result Value Ref Range   Sodium 138 135 - 145 mmol/L   Potassium 3.5 3.5 - 5.1 mmol/L   Chloride 111 98 - 111 mmol/L    Comment: Please note change in reference range.   CO2 19 (L) 22 - 32 mmol/L   Glucose, Bld 141 (H) 70 - 99 mg/dL    Comment: Please note change in reference range.   BUN 6 6 - 20 mg/dL    Comment: Please note change in reference range.   Creatinine, Ser 0.59 0.44 - 1.00 mg/dL   Calcium 8.8 (L) 8.9 - 10.3 mg/dL   Total Protein 5.9 (L) 6.5 - 8.1 g/dL   Albumin 2.9 (L) 3.5 - 5.0 g/dL   AST 143 (H) 15 - 41 U/L   ALT 285 (H) 0 - 44 U/L    Comment: Please  note change in reference range.   Alkaline Phosphatase 158 (H) 38 - 126 U/L   Total Bilirubin  0.9 0.3 - 1.2 mg/dL   GFR calc non Af Amer >60 >60 mL/min   GFR calc Af Amer >60 >60 mL/min    Comment: (NOTE) The eGFR has been calculated using the CKD EPI equation. This calculation has not been validated in all clinical situations. eGFR's persistently <60 mL/min signify possible Chronic Kidney Disease.    Anion gap 8 5 - 15    Comment: Performed at [facility], [address] [place], [place] [address]  Pathologist smear review     Status: None   Collection Time: 07/23/17  1:39 AM  Result Value Ref Range   Path Review      Peripheral blood smear reviewed per clinician request.    Comment: PPROM at 35 wks with bleeding and abdominal pain. Unremarkable WBC, RBC, and platelet morphologies. Reviewed by Dellia Nims Reuel Derby, M.D. Performed at [facility], [address] [place], [place] [address]   Glucose, capillary     Status: Abnormal   Collection Time: 07/23/17  2:18 AM  Result Value Ref Range   Glucose-Capillary 119 (H) 70 - 99 mg/dL  Urinalysis, Complete w Microscopic     Status: Abnormal   Collection Time: 07/23/17  4:29 AM  Result Value Ref Range   Color, Urine COLORLESS (A) YELLOW   APPearance CLEAR (A) CLEAR   Specific Gravity, Urine 1.001 (L) 1.005 - 1.030   pH 7.0 5.0 - 8.0   Glucose, UA NEGATIVE NEGATIVE mg/dL   Hgb urine dipstick NEGATIVE NEGATIVE   Bilirubin Urine NEGATIVE NEGATIVE   Ketones, ur NEGATIVE NEGATIVE mg/dL   Protein, ur NEGATIVE NEGATIVE mg/dL   Nitrite NEGATIVE NEGATIVE   Leukocytes, UA NEGATIVE NEGATIVE   WBC, UA 0-5 0 - 5 WBC/hpf   Bacteria, UA NONE SEEN NONE SEEN   Squamous Epithelial / LPF 0-5 0 - 5    Comment: Performed at [facility], [address] [place], [place] [address]  Lactic acid, plasma     Status: None   Collection Time: 07/23/17  4:29 AM  Result Value Ref Range   Lactic Acid, Venous 1.6 0.5 - 1.9  mmol/L    Comment: Performed at [facility], [address] [place], [place] [address]  Glucose, capillary     Status: None   Collection Time: 07/23/17  6:47 AM  Result Value Ref Range   Glucose-Capillary 86 70 - 99 mg/dL  Glucose, capillary     Status: None   Collection Time: 07/23/17 12:50 PM  Result Value Ref Range   Glucose-Capillary 90 70 - 99 mg/dL  Lactate dehydrogenase     Status: None   Collection Time: 07/23/17  2:26 PM  Result Value Ref Range   LDH 182 98 - 192 U/L    Comment: Performed at [facility], [address] [place], [place] [address]  Gamma GT     Status: None   Collection Time: 07/23/17  2:26 PM  Result Value Ref Range   GGT 15 7 - 50 U/L    Comment: Performed at [facility], [place] [address], [place], [place] [address]  Kleihauer-Betke stain     Status: None   Collection Time: 07/23/17  2:26 PM  Result Value Ref Range   Fetal Cells % 0 %   Quantitation Fetal Hemoglobin 0.0000 mL   # Vials RhIg 1     Comment: Performed at [facility], [address], [place], [place] [address]  Vitamin B12  Status: None   Collection Time: 07/23/17  2:26 PM  Result Value Ref Range   Vitamin B-12 576 180 - 914 pg/mL    Comment: (NOTE) This assay is not validated for testing neonatal or myeloproliferative syndrome specimens for Vitamin B12 levels. Performed at [facility], [place] [address], [place], [place] [address]   Folate     Status: None   Collection Time: 07/23/17  2:26 PM  Result Value Ref Range   Folate 14.6 >5.9 ng/mL    Comment: Performed at [facility], [address] [place], [place] [address]  Iron and TIBC     Status: Abnormal   Collection Time: 07/23/17  2:26 PM  Result Value Ref Range   Iron 14 (L) 28 - 170 ug/dL   TIBC 555 (H) 250 - 450 ug/dL   Saturation Ratios 3 (L) 10.4 - 31.8 %   UIBC 541 ug/dL    Comment: Performed at [facility], [address] [place], [place] [address]   Ferritin     Status: Abnormal   Collection Time: 07/23/17  2:26 PM  Result Value Ref Range   Ferritin 8 (L) 11 - 307 ng/mL    Comment: Performed at [facility], [address] [place], [place] [address]  Reticulocytes     Status: Abnormal   Collection Time: 07/23/17  2:26 PM  Result Value Ref Range   Retic Ct Pct 2.0 0.4 - 3.1 %   RBC. 3.44 (L) 3.80 - 5.20 MIL/uL   Retic Count, Absolute 68.8 19.0 - 183.0 K/uL    Comment: Performed at [facility], [address] [place], [place] [address]  Glucose, capillary     Status: None   Collection Time: 07/23/17  8:39 PM  Result Value Ref Range   Glucose-Capillary 90 70 - 99 mg/dL  Glucose, capillary     Status: None   Collection Time: 07/24/17  5:52 AM  Result Value Ref Range   Glucose-Capillary 76 70 - 99 mg/dL  CBC     Status: Abnormal   Collection Time: 07/24/17  6:46 AM  Result Value Ref Range   WBC 12.0 (H) 3.6 - 11.0 K/uL   RBC 2.93 (L) 3.80 - 5.20 MIL/uL   Hemoglobin 8.5 (L) 12.0 - 16.0 g/dL   HCT 25.4 (L) 35.0 - 47.0 %   MCV 86.8 80.0 - 100.0 fL   MCH 29.1 26.0 - 34.0 pg   MCHC 33.5 32.0 - 36.0 g/dL   RDW 14.9 (H) 11.5 - 14.5 %   Platelets 203 150 - 440 K/uL    Comment: Performed at [facility], [address] [place], [place] [address]  Comprehensive metabolic panel     Status: Abnormal   Collection Time: 07/24/17  6:46 AM  Result Value Ref Range   Sodium 140 135 - 145 mmol/L   Potassium 3.3 (L) 3.5 - 5.1 mmol/L   Chloride 113 (H) 98 - 111 mmol/L    Comment: Please note change in reference range.   CO2 21 (L) 22 - 32 mmol/L   Glucose, Bld 75 70 - 99 mg/dL    Comment: Please note change in reference range.   BUN 9 6 - 20 mg/dL    Comment: Please note change in reference range.   Creatinine, Ser 0.68 0.44 - 1.00 mg/dL   Calcium 8.1 (L) 8.9 - 10.3 mg/dL   Total Protein 5.1 (L) 6.5 - 8.1 g/dL   Albumin 2.4 (L) 3.5 -  5.0 g/dL   AST 125 (H) 15 - 41 U/L   ALT 287 (H) 0 - 44 U/L     Comment: Please note change in reference range.   Alkaline Phosphatase 140 (H) 38 - 126 U/L   Total Bilirubin 0.8 0.3 - 1.2 mg/dL   GFR calc non Af Amer >60 >60 mL/min   GFR calc Af Amer >60 >60 mL/min    Comment: (NOTE) The eGFR has been calculated using the CKD EPI equation. This calculation has not been validated in all clinical situations. eGFR's persistently <60 mL/min signify possible Chronic Kidney Disease.    Anion gap 6 5 - 15    Comment: Performed at [facility], [address] [place], [place] [address]  RPR     Status: None   Collection Time: 07/24/17  6:46 AM  Result Value Ref Range   RPR Ser Ql Non Reactive Non Reactive    Comment: (NOTE) Performed At: [facility] [place], [place] [id number] Rush Farmer MD MH:[id number] Performed at [facility], [place], [place] [address]   Rapid HIV screen (HIV 1/2 Ab+Ag) ([facility] Only)     Status: None   Collection Time: 07/24/17  6:46 AM  Result Value Ref Range   HIV-1 P24 Antigen - HIV24 NON REACTIVE NON REACTIVE   HIV 1/2 Antibodies NON REACTIVE NON REACTIVE   Interpretation (HIV Ag Ab)      A non reactive test result means that HIV 1 or HIV 2 antibodies and HIV 1 p24 antigen were not detected in the specimen.    Comment: Performed at [facility], [address] [place], [place] [address]  Glucose, capillary     Status: None   Collection Time: 07/24/17 12:15 PM  Result Value Ref Range   Glucose-Capillary 85 70 - 99 mg/dL  Glucose, capillary     Status: None   Collection Time: 07/24/17  6:39 PM  Result Value Ref Range   Glucose-Capillary 77 70 - 99 mg/dL  Glucose, capillary     Status: None   Collection Time: 07/24/17  8:50 PM  Result Value Ref Range   Glucose-Capillary 73 70 - 99 mg/dL   Comment 1 Notify RN   CBC     Status: Abnormal   Collection Time: 07/25/17  6:46 AM  Result Value Ref Range   WBC 21.5 (H) 3.6 - 11.0 K/uL   RBC 3.63 (L)  3.80 - 5.20 MIL/uL   Hemoglobin 10.3 (L) 12.0 - 16.0 g/dL   HCT 30.8 (L) 35.0 - 47.0 %   MCV 84.8 80.0 - 100.0 fL   MCH 28.3 26.0 - 34.0 pg   MCHC 33.4 32.0 - 36.0 g/dL   RDW 14.9 (H) 11.5 - 14.5 %   Platelets 201 150 - 440 K/uL    Comment: Performed at [facility], [address] [place],  [address]  Comprehensive metabolic panel     Status: Abnormal   Collection Time: 07/25/17  6:46 AM  Result Value Ref Range   Sodium 135 135 - 145 mmol/L   Potassium 3.3 (L) 3.5 - 5.1 mmol/L   Chloride 108 98 - 111 mmol/L    Comment: Please note change in reference range.   CO2 22 22 - 32 mmol/L   Glucose, Bld 80 70 - 99 mg/dL    Comment: Please note change in reference range.   BUN 6 6 - 20 mg/dL    Comment: Please note  change in reference range.   Creatinine, Ser 0.50 0.44 - 1.00 mg/dL   Calcium 8.3 (L) 8.9 - 10.3 mg/dL   Total Protein 5.0 (L) 6.5 - 8.1 g/dL   Albumin 2.3 (L) 3.5 - 5.0 g/dL   AST 129 (H) 15 - 41 U/L   ALT 285 (H) 0 - 44 U/L    Comment: Please note change in reference range.   Alkaline Phosphatase 152 (H) 38 - 126 U/L   Total Bilirubin 1.4 (H) 0.3 - 1.2 mg/dL   GFR calc non Af Amer >60 >60 mL/min   GFR calc Af Amer >60 >60 mL/min    Comment: (NOTE) The eGFR has been calculated using the CKD EPI equation. This calculation has not been validated in all clinical situations. eGFR's persistently <60 mL/min signify possible Chronic Kidney Disease.    Anion gap 5 5 - 15    Comment: Performed at [facility], [address] [place], [place] [address]    Assessment:   22 y.o. G1P0101 postpartum day # 0 in good condition.  Plan:    1) Acute blood loss anemia - hemodynamically stable and asymptomatic. H&H have trended upward following delivery. - PO ferrous sulfate  2) Blood Type --/--/O NEG Performed at [facility], [address] [place], [place] [address]  [phone]/27 [phone]) /  Information for the patient's newborn:  Izzy, Doubek [[id number]]  O NEG RhoGAM not indicated postpartm  3) Rubella 2.61 (12/24 0910) / Varicella non-immune, Varivax is ordered/ TDAP status: given 06/16/2017.  4) Breast feeding  5) Contraception - not discussed  6) Disposition: continue postpartum care.   Avel Sensor, CNM 07/25/2017  10:29 AM

## 2017-07-25 NOTE — Discharge Summary (Signed)
OB Discharge Summary     Patient Name: Christine Arroyo DOB: November 08, 1995 MRN: [id number]  Date of admission: 07/21/2017 Delivering MD: Dr Gilman Schmidt, MD  Date of Delivery: 07/21/2017  Date of discharge: 07/26/2017  Admitting diagnosis: 35 wks preg Intrauterine pregnancy: [redacted]w[redacted]d    Secondary diagnosis: Anemia     Discharge diagnosis: Preterm Pregnancy Delivered, Preterm Premature Rupture of Membranes                          Hospital course:  Induction of Labor With Vaginal Delivery   22y.o. yo G1P0101 at 391w2das admitted to the hospital 07/21/2017 for induction of labor.  Indication for induction: PPROM.  Patient had an uncomplicated labor course as follows: Membrane Rupture Time/Date: 5:45 AM ,07/24/2017   Intrapartum Procedures: Episiotomy: None [1]                                         Lacerations:  None [1]  Patient had delivery of a Viable infant.  Information for the patient's newborn:  ClReilynn, Lauro0[[id number]]Delivery Method: Vag-Spont   07/25/2017  Details of delivery can be found in separate delivery note.  Patient had a routine postpartum course. Patient is discharged home 07/26/17.                                                                 Post partum procedures:none  Complications: None  Physical exam on 07/26/2017: Vitals:   07/25/17 1100 07/25/17 1513 07/25/17 2304 07/26/17 0744  BP: (!) 143/88 123/85 138/86 131/76  Pulse: (!) 50 76 (!) 47 (!) 51  Resp: '18 20 18 18  ' Temp: 97.9 F (36.6 C) 98.2 F (36.8 C) 97.8 F (36.6 C) 97.9 F (36.6 C)  TempSrc: Oral Oral Oral Oral  SpO2:   99% 98%  Weight:      Height:       General: alert, cooperative and no distress Lochia: appropriate Uterine Fundus: firm Incision: N/A DVT Evaluation: No evidence of DVT seen on physical exam. Negative Homan's sign.  Labs: Lab Results  Component Value Date   WBC 21.5 (H) 07/25/2017   HGB 10.3 (L) 07/25/2017   HCT 30.8 (L) 07/25/2017   MCV 84.8 07/25/2017   PLT 201  07/25/2017   CMP Latest Ref Rng & Units 07/25/2017  Glucose 70 - 99 mg/dL 80  BUN 6 - 20 mg/dL 6  Creatinine 0.44 - 1.00 mg/dL 0.50  Sodium 135 - 145 mmol/L 135  Potassium 3.5 - 5.1 mmol/L 3.3(L)  Chloride 98 - 111 mmol/L 108  CO2 22 - 32 mmol/L 22  Calcium 8.9 - 10.3 mg/dL 8.3(L)  Total Protein 6.5 - 8.1 g/dL 5.0(L)  Total Bilirubin 0.3 - 1.2 mg/dL 1.4(H)  Alkaline Phos 38 - 126 U/L 152(H)  AST 15 - 41 U/L 129(H)  ALT 0 - 44 U/L 285(H)    Discharge instruction: per After Visit Summary.  Medications:  Allergies as of 07/26/2017      Reactions   Cephalexin Hives   Guaifenesin Hives   Penicillins Hives   Has patient had a PCN reaction causing immediate rash,  facial/tongue/throat swelling, SOB or lightheadedness with hypotension: Yes Has patient had a PCN reaction causing severe rash involving mucus membranes or skin necrosis: No Has patient had a PCN reaction that required hospitalization No Has patient had a PCN reaction occurring within the last 10 years: No If all of the above answers are "NO", then may proceed with Cephalosporin use.   Sulfa Antibiotics Hives   Amoxicillin Hives   Azithromycin Hives   Morphine And Related Hives   Prenatal Vitamins Nausea And Vomiting      Medication List    STOP taking these medications   glucose blood test strip   RELION PREMIER COMPACT SYSTEM w/Device Kit   RELION ULTRA THIN LANCETS 30G Misc     TAKE these medications   ibuprofen 600 MG tablet Commonly known as:  ADVIL,MOTRIN Take 1 tablet (600 mg total) by mouth every 6 (six) hours.   prenatal multivitamin Tabs tablet Take 1 tablet by mouth daily at 12 noon.   vitamin C 1000 MG tablet Take 1,000 mg by mouth daily.     Diet: routine diet  Activity: Advance as tolerated. Pelvic rest for 6 weeks.   Outpatient follow up: Follow-up Information    Schuman, Stefanie Libel, MD. Schedule an appointment as soon as possible for a visit in 6 week(s).   Specialty:  Obstetrics and  Gynecology Contact information: [address] [place] [address] [phone]            Postpartum contraception: None desired by pateint.  She has had side effects to most forms of hormonal birth control in past, and does not desire copper IUD.  She plans to use abstinance for now and condoms later. Rhogam Given postpartum: not needed, baby is Rh Neg Rubella vaccine given postpartum: no Varicella vaccine given postpartum: no, pt has had 3 vaccinations w last one <1 year ago (never turns Ab testing as immune) TDaP given antepartum or postpartum: Yes  Newborn Data: Live born female  Birth Weight: 6 lb 2.1 oz (2780 g) APGAR: 8, 9  Newborn Delivery   Birth date/time:  07/25/2017 00:23:00 Delivery type:  Vaginal, Spontaneous   Baby Feeding: Breast Disposition:home with mother  SIGNED: Hoyt Koch, MD 07/26/2017 8:13 AM

## 2017-07-26 LAB — ANTI-SMOOTH MUSCLE ANTIBODY, IGG: F-Actin IgG: 2 Units (ref 0–19)

## 2017-07-26 MED ORDER — IBUPROFEN 600 MG PO TABS: 600.0000 mg | ORAL_TABLET | Freq: Four times a day (QID) | ORAL | 0 refills | Status: AC

## 2017-07-26 MED ORDER — PRENATAL MULTIVITAMIN CH: 1.0000 | ORAL_TABLET | Freq: Every day | ORAL | 6 refills | Status: AC

## 2017-07-26 NOTE — Progress Notes (Signed)
PPD#1 SVD Subjective:  Pain control is good. Voiding without difficulty. Tolerating a regular diet. Ambulating well. Breast feeding  Objective:  BP 131/76 (BP Location: Right Arm)   Pulse (!) 51   Temp 97.9 F (36.6 C) (Oral)   Resp 18   Ht 5\' 1"  (1.549 m)   Wt 143 lb (64.9 kg)   LMP 11/15/2016 (Exact Date)   SpO2 98%   Breastfeeding? Unknown   BMI 27.02 kg/m  General: NAD Pulmonary: no increased work of breathing Abdomen: non-distended, non-tender Uterus:  fundus firm; lochia appropraite Laceration: N/A Extremities: no edema, no erythema, no tenderness, no signs of DVT  Assessment:   22 y.o. G1P0101 postpartum day # 1 in good condition.  Plan:  1) Acute blood loss anemia - hemodynamically stable and asymptomatic. H&H have trended upward following delivery. - PO ferrous sulfate  2) Blood Type --/--/O NEG Performed at [facility], [address] [place], [place] [address]  [phone](06/27 2137) /  Information for the patient's newborn:  Ladonna SnideClow, Boy Abrey [[id number]][[id number]]  O NEG RhoGAM not indicated postpartm  3) Rubella 2.61 (12/24 0910) / Varicella non-immune, Varivax is discussed/ TDAP status: given 06/16/2017. Pt has had multiple varicella vaccinations without conversion to immune status by lab testing.  Last vaccine was <1 year ago.  We will forego the vaccination at this time.  Consider repeat testing at 6 weeks and even consideration for referral.  4) Breast feeding  5) Contraception - none desired.  Reports side effects to ,most hormonal forms of birth control.  Offered copper IUD as an option.  To consider.  6) Disposition: continue postpartum care. Home today  Annamarie MajorPaul Harris, MD 07/26/2017  8:17 AM

## 2017-07-26 NOTE — Progress Notes (Signed)
Patient discharged home with infant. Discharge instructions and prescriptions given and reviewed with patient. Patient verbalized understanding. Escorted out by auxillary.  

## 2017-07-26 NOTE — Discharge Instructions (Signed)
Breast Pumping Tips °If you are breastfeeding, there may be times when you cannot feed your baby directly. Returning to work or going on a trip are common examples. Pumping allows you to store breast milk and feed it to your baby later. °You may not get much milk when you first start to pump. Your breasts should start to make more after a few days. If you pump at the times you usually feed your baby, you may be able to keep making enough milk to feed your baby without also using formula. The more often you pump, the more milk you will produce. °When should I pump? °· You can begin to pump soon after delivery. However, some experts recommend waiting about 4 weeks before giving your infant a bottle to make sure breastfeeding is going well. °· If you plan to return to work, begin pumping a few weeks before. This will help you develop techniques that work best for you. It also lets you build up a supply of breast milk. °· When you are with your infant, feed on demand and pump after each feeding. °· When you are away from your infant for several hours, pump for about 15 minutes every 2-3 hours. Pump both breasts at the same time if you can. °· If your infant has a formula feeding, make sure to pump around the same time. °· If you drink any alcohol, wait 2 hours before pumping. °How do I prepare to pump? °Your let-down reflex is the natural reaction to stimulation that makes your breast milk flow. It is easier to stimulate this reflex when you are relaxed. Find relaxation techniques that work for you. If you have difficulty with your let-down reflex, try these methods: °· Smell one of your infant's blankets or an item of clothing. °· Look at a picture or video of your infant. °· Sit in a quiet, private space. °· Massage the breast you plan to pump. °· Place soothing warmth on the breast. °· Play relaxing music. ° °What are some general breast pumping tips? °· Wash your hands before you pump. You do not need to wash your  nipples or breasts. °· There are three ways to pump. °? You can use your hand to massage and compress your breast. °? You can use a handheld manual pump. °? You can use an electric pump. °· Make sure the suction cup (flange) on the breast pump is the right size. Place the flange directly over the nipple. If it is the wrong size or placed the wrong way, it may be painful and cause nipple damage. °· If pumping is uncomfortable, apply a small amount of purified or modified lanolin to your nipple and areola. °· If you are using an electric pump, adjust the speed and suction power to be more comfortable. °· If pumping is painful or if you are not getting very much milk, you may need a different type of pump. A lactation consultant can help you determine what type of pump to use. °· Keep a full water bottle near you at all times. Drinking lots of fluid helps you make more milk. °· You can store your milk to use later. Pumped breast milk can be stored in a sealable, sterile container or plastic bag. Label all stored breast milk with the date you pumped it. °? Milk can stay out at room temperature for up to 8 hours. °? You can store your milk in the refrigerator for up to 8 days. °? You can   store your milk in the freezer for 3 months. Thaw frozen milk using warm water. Do not put it in the microwave.  Do not smoke. Smoking can lower your milk supply and harm your infant. If you need help quitting, ask your health care provider to recommend a program. When should I call my health care provider or a lactation consultant?  You are having trouble pumping.  You are concerned that you are not making enough milk.  You have nipple pain, soreness, or redness.  You want to use birth control. Birth control pills may lower your milk supply. Talk to your health care provider about your options. This information is not intended to replace advice given to you by your health care provider. Make sure you discuss any questions  you have with your health care provider. Document Released: 07/01/2009 Document Revised: 06/25/2015 Document Reviewed: 11/03/2012 Elsevier Interactive Patient Education  2017 [employer] Breastfeeding Choosing to breastfeed is one of the best decisions you can make for yourself and your baby. A change in hormones during pregnancy causes your breasts to make breast milk in your milk-producing glands. Hormones prevent breast milk from being released before your baby is born. They also prompt milk flow after birth. Once breastfeeding has begun, thoughts of your baby, as well as his or her sucking or crying, can stimulate the release of milk from your milk-producing glands. Benefits of breastfeeding Research shows that breastfeeding offers many health benefits for infants and mothers. It also offers a cost-free and convenient way to feed your baby. For your baby  Your first milk (colostrum) helps your baby's digestive system to function better.  Special cells in your milk (antibodies) help your baby to fight off infections.  Breastfed babies are less likely to develop asthma, allergies, obesity, or type 2 diabetes. They are also at lower risk for sudden infant death syndrome (SIDS).  Nutrients in breast milk are better able to meet your babys needs compared to infant formula.  Breast milk improves your baby's brain development. For you  Breastfeeding helps to create a very special bond between you and your baby.  Breastfeeding is convenient. Breast milk costs nothing and is always available at the correct temperature.  Breastfeeding helps to burn calories. It helps you to lose the weight that you gained during pregnancy.  Breastfeeding makes your uterus return faster to its size before pregnancy. It also slows bleeding (lochia) after you give birth.  Breastfeeding helps to lower your risk of developing type 2 diabetes, osteoporosis, rheumatoid arthritis, cardiovascular disease, and  breast, ovarian, uterine, and endometrial cancer later in life. Breastfeeding basics Starting breastfeeding  Find a comfortable place to sit or lie down, with your neck and back well-supported.  Place a pillow or a rolled-up blanket under your baby to bring him or her to the level of your breast (if you are seated). Nursing pillows are specially designed to help support your arms and your baby while you breastfeed.  Make sure that your baby's tummy (abdomen) is facing your abdomen.  Gently massage your breast. With your fingertips, massage from the outer edges of your breast inward toward the nipple. This encourages milk flow. If your milk flows slowly, you may need to continue this action during the feeding.  Support your breast with 4 fingers underneath and your thumb above your nipple (make the letter "C" with your hand). Make sure your fingers are well away from your nipple and your babys mouth.  Stroke your baby's lips  gently with your finger or nipple.  When your baby's mouth is open wide enough, quickly bring your baby to your breast, placing your entire nipple and as much of the areola as possible into your baby's mouth. The areola is the colored area around your nipple. ? More areola should be visible above your baby's upper lip than below the lower lip. ? Your baby's lips should be opened and extended outward (flanged) to ensure an adequate, comfortable latch. ? Your baby's tongue should be between his or her lower gum and your breast.  Make sure that your baby's mouth is correctly positioned around your nipple (latched). Your baby's lips should create a seal on your breast and be turned out (everted).  It is common for your baby to suck about 2-3 minutes in order to start the flow of breast milk. Latching Teaching your baby how to latch onto your breast properly is very important. An improper latch can cause nipple pain, decreased milk supply, and poor weight gain in your baby.  Also, if your baby is not latched onto your nipple properly, he or she may swallow some air during feeding. This can make your baby fussy. Burping your baby when you switch breasts during the feeding can help to get rid of the air. However, teaching your baby to latch on properly is still the best way to prevent fussiness from swallowing air while breastfeeding. Signs that your baby has successfully latched onto your nipple  Silent tugging or silent sucking, without causing you pain. Infant's lips should be extended outward (flanged).  Swallowing heard between every 3-4 sucks once your milk has started to flow (after your let-down milk reflex occurs).  Muscle movement above and in front of his or her ears while sucking.  Signs that your baby has not successfully latched onto your nipple  Sucking sounds or smacking sounds from your baby while breastfeeding.  Nipple pain.  If you think your baby has not latched on correctly, slip your finger into the corner of your babys mouth to break the suction and place it between your baby's gums. Attempt to start breastfeeding again. Signs of successful breastfeeding Signs from your baby  Your baby will gradually decrease the number of sucks or will completely stop sucking.  Your baby will fall asleep.  Your baby's body will relax.  Your baby will retain a small amount of milk in his or her mouth.  Your baby will let go of your breast by himself or herself.  Signs from you  Breasts that have increased in firmness, weight, and size 1-3 hours after feeding.  Breasts that are softer immediately after breastfeeding.  Increased milk volume, as well as a change in milk consistency and color by the fifth day of breastfeeding.  Nipples that are not sore, cracked, or bleeding.  Signs that your baby is getting enough milk  Wetting at least 1-2 diapers during the first 24 hours after birth.  Wetting at least 5-6 diapers every 24 hours for the  first week after birth. The urine should be clear or pale yellow by the age of 5 days.  Wetting 6-8 diapers every 24 hours as your baby continues to grow and develop.  At least 3 stools in a 24-hour period by the age of 5 days. The stool should be soft and yellow.  At least 3 stools in a 24-hour period by the age of 7 days. The stool should be seedy and yellow.  No loss of weight  greater than 10% of birth weight during the first 3 days of life.  Average weight gain of 4-7 oz (113-198 g) per week after the age of 4 days.  Consistent daily weight gain by the age of 5 days, without weight loss after the age of 2 weeks. After a feeding, your baby may spit up a small amount of milk. This is normal. Breastfeeding frequency and duration Frequent feeding will help you make more milk and can prevent sore nipples and extremely full breasts (breast engorgement). Breastfeed when you feel the need to reduce the fullness of your breasts or when your baby shows signs of hunger. This is called "breastfeeding on demand." Signs that your baby is hungry include:  Increased alertness, activity, or restlessness.  Movement of the head from side to side.  Opening of the mouth when the corner of the mouth or cheek is stroked (rooting).  Increased sucking sounds, smacking lips, cooing, sighing, or squeaking.  Hand-to-mouth movements and sucking on fingers or hands.  Fussing or crying.  Avoid introducing a pacifier to your baby in the first 4-6 weeks after your baby is born. After this time, you may choose to use a pacifier. Research has shown that pacifier use during the first year of a baby's life decreases the risk of sudden infant death syndrome (SIDS). Allow your baby to feed on each breast as long as he or she wants. When your baby unlatches or falls asleep while feeding from the first breast, offer the second breast. Because newborns are often sleepy in the first few weeks of life, you may need to awaken  your baby to get him or her to feed. Breastfeeding times will vary from baby to baby. However, the following rules can serve as a guide to help you make sure that your baby is properly fed:  Newborns (babies 53 weeks of age or younger) may breastfeed every 1-3 hours.  Newborns should not go without breastfeeding for longer than 3 hours during the day or 5 hours during the night.  You should breastfeed your baby a minimum of 8 times in a 24-hour period.  Breast milk pumping Pumping and storing breast milk allows you to make sure that your baby is exclusively fed your breast milk, even at times when you are unable to breastfeed. This is especially important if you go back to work while you are still breastfeeding, or if you are not able to be present during feedings. Your lactation consultant can help you find a method of pumping that works best for you and give you guidelines about how long it is safe to store breast milk. Caring for your breasts while you breastfeed Nipples can become dry, cracked, and sore while breastfeeding. The following recommendations can help keep your breasts moisturized and healthy:  Avoid using soap on your nipples.  Wear a supportive bra designed especially for nursing. Avoid wearing underwire-style bras or extremely tight bras (sports bras).  Air-dry your nipples for 3-4 minutes after each feeding.  Use only cotton bra pads to absorb leaked breast milk. Leaking of breast milk between feedings is normal.  Use lanolin on your nipples after breastfeeding. Lanolin helps to maintain your skin's normal moisture barrier. Pure lanolin is not harmful (not toxic) to your baby. You may also hand express a few drops of breast milk and gently massage that milk into your nipples and allow the milk to air-dry.  In the first few weeks after giving birth, some women experience breast  engorgement. Engorgement can make your breasts feel heavy, warm, and tender to the touch.  Engorgement peaks within 3-5 days after you give birth. The following recommendations can help to ease engorgement:  Completely empty your breasts while breastfeeding or pumping. You may want to start by applying warm, moist heat (in the shower or with warm, water-soaked hand towels) just before feeding or pumping. This increases circulation and helps the milk flow. If your baby does not completely empty your breasts while breastfeeding, pump any extra milk after he or she is finished.  Apply ice packs to your breasts immediately after breastfeeding or pumping, unless this is too uncomfortable for you. To do this: ? Put ice in a plastic bag. ? Place a towel between your skin and the bag. ? Leave the ice on for 20 minutes, 2-3 times a day.  Make sure that your baby is latched on and positioned properly while breastfeeding.  If engorgement persists after 48 hours of following these recommendations, contact your health care provider or a Advertising copy[employer]. Overall health care recommendations while breastfeeding  Eat 3 healthy meals and 3 snacks every day. Well-nourished mothers who are breastfeeding need an additional 450-500 calories a day. You can meet this requirement by increasing the amount of a balanced diet that you eat.  Drink enough water to keep your urine pale yellow or clear.  Rest often, relax, and continue to take your prenatal vitamins to prevent fatigue, stress, and low vitamin and mineral levels in your body (nutrient deficiencies).  Do not use any products that contain nicotine or tobacco, such as cigarettes and e-cigarettes. Your baby may be harmed by chemicals from cigarettes that pass into breast milk and exposure to secondhand smoke. If you need help quitting, ask your health care provider.  Avoid alcohol.  Do not use illegal drugs or marijuana.  Talk with your health care provider before taking any medicines. These include over-the-counter and prescription medicines  as well as vitamins and herbal supplements. Some medicines that may be harmful to your baby can pass through breast milk.  It is possible to become pregnant while breastfeeding. If birth control is desired, ask your health care provider about options that will be safe while breastfeeding your baby. Where to find more information: [employer]: www.llli.org Contact a health care provider if:  You feel like you want to stop breastfeeding or have become frustrated with breastfeeding.  Your nipples are cracked or bleeding.  Your breasts are red, tender, or warm.  You have: ? Painful breasts or nipples. ? A swollen area on either breast. ? A fever or chills. ? Nausea or vomiting. ? Drainage other than breast milk from your nipples.  Your breasts do not become full before feedings by the fifth day after you give birth.  You feel sad and depressed.  Your baby is: ? Too sleepy to eat well. ? Having trouble sleeping. ? More than 67 week old and wetting fewer than 6 diapers in a 24-hour period. ? Not gaining weight by 23 days of age.  Your baby has fewer than 3 stools in a 24-hour period.  Your baby's skin or the white parts of his or her eyes become yellow. Get help right away if:  Your baby is overly tired (lethargic) and does not want to wake up and feed.  Your baby develops an unexplained fever. Summary  Breastfeeding offers many health benefits for infant and mothers.  Try to breastfeed your infant when he  or she shows early signs of hunger.  Gently tickle or stroke your baby's lips with your finger or nipple to allow the baby to open his or her mouth. Bring the baby to your breast. Make sure that much of the areola is in your baby's mouth. Offer one side and burp the baby before you offer the other side.  Talk with your health care provider or lactation consultant if you have questions or you face problems as you breastfeed. This information is not intended  to replace advice given to you by your health care provider. Make sure you discuss any questions you have with your health care provider. Document Released: 01/11/2005 Document Revised: 02/13/2016 Document Reviewed: 02/13/2016 Elsevier Interactive Patient Education  2018 [employer] Inc.   Vaginal Delivery, Care After Refer to this sheet in the next few weeks. These instructions provide you with information about caring for yourself after vaginal delivery. Your health care provider may also give you more specific instructions. Your treatment has been planned according to current medical practices, but problems sometimes occur. Call your health care provider if you have any problems or questions. What can I expect after the procedure? After vaginal delivery, it is common to have:  Some bleeding from your vagina.  Soreness in your abdomen, your vagina, and the area of skin between your vaginal opening and your anus (perineum).  Pelvic cramps.  Fatigue.  Follow these instructions at home: Medicines  Take over-the-counter and prescription medicines only as told by your health care provider.  If you were prescribed an antibiotic medicine, take it as told by your health care provider. Do not stop taking the antibiotic until it is finished. Driving   Do not drive or operate heavy machinery while taking prescription pain medicine.  Do not drive for 24 hours if you received a sedative. Lifestyle  Do not drink alcohol. This is especially important if you are breastfeeding or taking medicine to relieve pain.  Do not use tobacco products, including cigarettes, chewing tobacco, or e-cigarettes. If you need help quitting, ask your health care provider. Eating and drinking  Drink at least 8 eight-ounce glasses of water every day unless you are told not to by your health care provider. If you choose to breastfeed your baby, you may need to drink more water than this.  Eat high-fiber foods every  day. These foods may help prevent or relieve constipation. High-fiber foods include: ? Whole grain cereals and breads. ? Brown rice. ? Beans. ? Fresh fruits and vegetables. Activity  Return to your normal activities as told by your health care provider. Ask your health care provider what activities are safe for you.  Rest as much as possible. Try to rest or take a nap when your baby is sleeping.  Do not lift anything that is heavier than your baby or 10 lb (4.5 kg) until your health care provider says that it is safe.  Talk with your health care provider about when you can engage in sexual activity. This may depend on your: ? Risk of infection. ? Rate of healing. ? Comfort and desire to engage in sexual activity. Vaginal Care  If you have an episiotomy or a vaginal tear, check the area every day for signs of infection. Check for: ? More redness, swelling, or pain. ? More fluid or blood. ? Warmth. ? Pus or a bad smell.  Do not use tampons or douches until your health care provider says this is safe.  Watch for any  blood clots that may pass from your vagina. These may look like clumps of dark red, brown, or black discharge. General instructions  Keep your perineum clean and dry as told by your health care provider.  Wear loose, comfortable clothing.  Wipe from front to back when you use the toilet.  Ask your health care provider if you can shower or take a bath. If you had an episiotomy or a perineal tear during labor and delivery, your health care provider may tell you not to take baths for a certain length of time.  Wear a bra that supports your breasts and fits you well.  If possible, have someone help you with household activities and help care for your baby for at least a few days after you leave the hospital.  Keep all follow-up visits for you and your baby as told by your health care provider. This is important. Contact a health care provider if:  You  have: ? Vaginal discharge that has a bad smell. ? Difficulty urinating. ? Pain when urinating. ? A sudden increase or decrease in the frequency of your bowel movements. ? More redness, swelling, or pain around your episiotomy or vaginal tear. ? More fluid or blood coming from your episiotomy or vaginal tear. ? Pus or a bad smell coming from your episiotomy or vaginal tear. ? A fever. ? A rash. ? Little or no interest in activities you used to enjoy. ? Questions about caring for yourself or your baby.  Your episiotomy or vaginal tear feels warm to the touch.  Your episiotomy or vaginal tear is separating or does not appear to be healing.  Your breasts are painful, hard, or turn red.  You feel unusually sad or worried.  You feel nauseous or you vomit.  You pass large blood clots from your vagina. If you pass a blood clot from your vagina, save it to show to your health care provider. Do not flush blood clots down the toilet without having your health care provider look at them.  You urinate more than usual.  You are dizzy or light-headed.  You have not breastfed at all and you have not had a menstrual period for 12 weeks after delivery.  You have stopped breastfeeding and you have not had a menstrual period for 12 weeks after you stopped breastfeeding. Get help right away if:  You have: ? Pain that does not go away or does not get better with medicine. ? Chest pain. ? Difficulty breathing. ? Blurred vision or spots in your vision. ? Thoughts about hurting yourself or your baby.  You develop pain in your abdomen or in one of your legs.  You develop a severe headache.  You faint.  You bleed from your vagina so much that you fill two sanitary pads in one hour. This information is not intended to replace advice given to you by your health care provider. Make sure you discuss any questions you have with your health care provider. Document Released: 01/09/2000 Document  Revised: 06/25/2015 Document Reviewed: 01/26/2015 Elsevier Interactive Patient Education  2018 [employer].

## 2017-07-26 NOTE — Anesthesia Postprocedure Evaluation (Signed)
Anesthesia Post Note  Patient: Christine Arroyo  Procedure(s) Performed: AN AD HOC LABOR EPIDURAL  Patient location during evaluation: Mother Baby Anesthesia Type: Epidural Level of consciousness: awake, awake and alert and oriented Pain management: pain level controlled Vital Signs Assessment: post-procedure vital signs reviewed and stable Respiratory status: spontaneous breathing, nonlabored ventilation and respiratory function stable Cardiovascular status: blood pressure returned to baseline and stable Postop Assessment: no headache and no backache Anesthetic complications: no     Last Vitals:  Vitals:   07/25/17 2304 07/26/17 0744  BP: 138/86 131/76  Pulse: (!) 47 (!) 51  Resp: 18 18  Temp: 36.6 C 36.6 C  SpO2: 99% 98%    Last Pain:  Vitals:   07/26/17 0744  TempSrc: Oral  PainSc:                  Ginger CarneStephanie Gabryelle Whitmoyer

## 2017-07-27 ENCOUNTER — Encounter: Payer: BLUE CROSS/BLUE SHIELD | Admitting: Maternal Newborn

## 2017-07-27 ENCOUNTER — Other Ambulatory Visit: Payer: BLUE CROSS/BLUE SHIELD

## 2017-07-27 LAB — ANA W/REFLEX: Anti Nuclear Antibody(ANA): NEGATIVE

## 2017-07-27 LAB — C4 COMPLEMENT: Complement C4, Body Fluid: 18 mg/dL (ref 14–44)

## 2017-07-27 LAB — SJOGRENS SYNDROME-A EXTRACTABLE NUCLEAR ANTIBODY

## 2017-07-27 LAB — C3 COMPLEMENT: C3 Complement: 108 mg/dL (ref 82–167)

## 2017-07-27 LAB — ANTI-MICROSOMAL ANTIBODY LIVER / KIDNEY: LKM1 Ab: 0.9 Units (ref 0.0–20.0)

## 2017-07-27 LAB — SJOGRENS SYNDROME-B EXTRACTABLE NUCLEAR ANTIBODY: SSB (La) (ENA) Antibody, IgG: <0.2 AI (ref 0.0–0.9)

## 2017-07-27 NOTE — Progress Notes (Signed)
Released to mychart

## 2017-09-08 ENCOUNTER — Encounter: Payer: Self-pay | Admitting: Obstetrics and Gynecology

## 2017-09-08 ENCOUNTER — Ambulatory Visit (INDEPENDENT_AMBULATORY_CARE_PROVIDER_SITE_OTHER): Payer: BLUE CROSS/BLUE SHIELD | Admitting: Obstetrics and Gynecology

## 2017-09-08 VITALS — BP 112/80 | HR 81 | Ht 62.0 in | Wt 134.0 lb

## 2017-09-08 DIAGNOSIS — O24410 Gestational diabetes mellitus in pregnancy, diet controlled: Secondary | ICD-10-CM

## 2017-09-08 NOTE — Progress Notes (Signed)
  OBSTETRICS POSTPARTUM CLINIC PROGRESS NOTE  Subjective:     Christine Arroyo is a 22 y.o. [id number] female who presents for a postpartum visit. She is 6 weeks postpartum following a Preterm pregnancy <37 weeks and delivery by Vaginal, no problems at delivery.  I have fully reviewed the prenatal and intrapartum course. Anesthesia: epidural.  Postpartum course has been complicated by uncomplicated.  Baby is feeding by Breast.  Bleeding: patient has not  resumed menses.  Bowel function is normal. Bladder function is normal.  Patient is not sexually active. Contraception method desired is none.  Postpartum depression screening: negative. Edinburgh 2.  The following portions of the patient's history were reviewed and updated as appropriate: allergies, current medications, past family history, past medical history, past social history, past surgical history and problem list.  Review of Systems Pertinent items are noted in HPI.  Objective:    BP 112/80 (BP Location: Left Arm, Patient Position: Sitting, Cuff Size: Normal)   Pulse 81   Ht 5\' 2"  (1.575 m)   Wt 134 lb (60.8 kg)   Breastfeeding? Yes   BMI 24.51 kg/m   General:  alert and no distress   Breasts:  inspection negative, no nipple discharge or bleeding, no masses or nodularity palpable  Lungs: clear to auscultation bilaterally  Heart:  regular rate and rhythm, S1, S2 normal, no murmur, click, rub or gallop  Abdomen: soft, non-tender; bowel sounds normal; no masses,  no organomegaly.     Vulva:  normal  Vagina: normal vagina, no discharge, exudate, lesion, or erythema  Cervix:  no cervical motion tenderness and no lesions  Corpus: normal size, contour, position, consistency, mobility, non-tender  Adnexa:  normal adnexa and no mass, fullness, tenderness  Rectal Exam: Not performed.          Assessment:  Post Partum Care visit 1. Diet controlled gestational diabetes mellitus (GDM) in third trimester - Glucose tolerance, 2 hours;  Future - Advised diabetes screening every 3 years.  Plan:  See orders and Patient Instructions Follow up in: 1 year or as needed.   Adelene Idlerhristanna Schuman MD [facility], [facility] 09/08/17 10:56 AM

## 2017-10-20 ENCOUNTER — Other Ambulatory Visit: Payer: BLUE CROSS/BLUE SHIELD

## 2019-07-12 IMAGING — MR MR ABDOMEN W/O CM
5 of 9 series · 22 of 48 positions shown · non-contrast
Comparison: Pelvic ultrasound from 02/15/2015. Abdominal ultrasound
from 07/23/2017

CLINICAL DATA: Abdominal pain. Vaginal bleeding. Third trimester
pregnancy.

EXAM:
MRI ABDOMEN AND PELVIS WITHOUT CONTRAST
TECHNIQUE: Multiplanar multisequence MR imaging of the abdomen and pelvis was
performed. No intravenous contrast was administered.

[Series 10: T2 · axial · 5.0mm · 0.72mm/px · z∈[-207,+267]mm · 6 of 80 slices shown (1 of 2)]
[im 1/80]
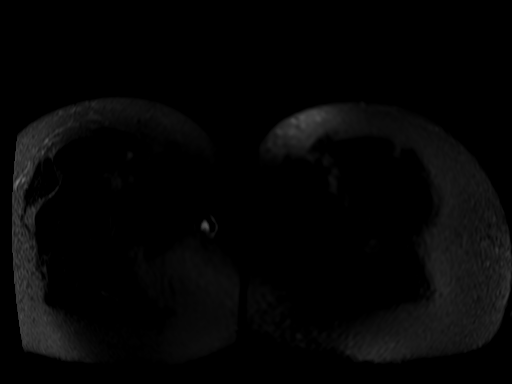
[im 16/80]
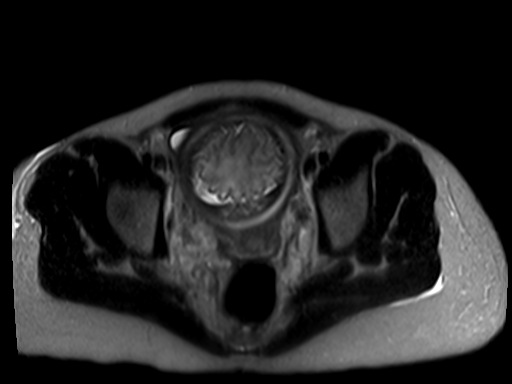
[im 32/80]
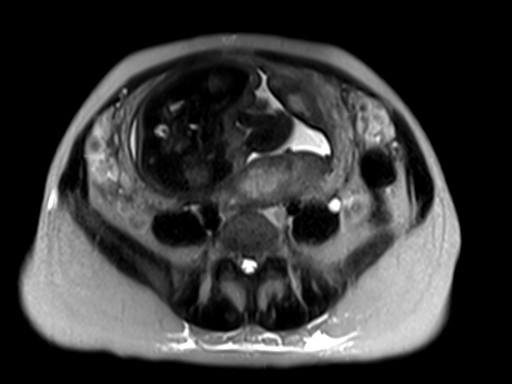
[im 48/80]
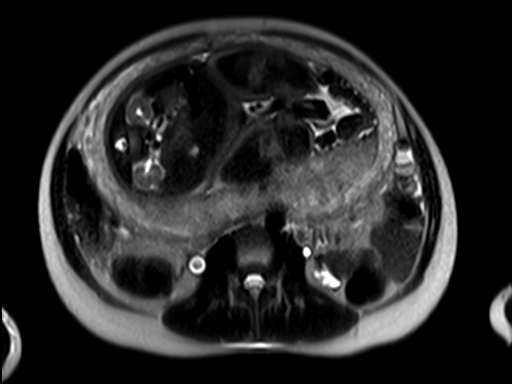
[im 64/80]
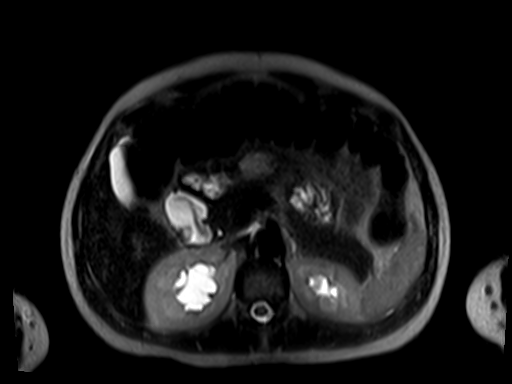
[im 80/80]
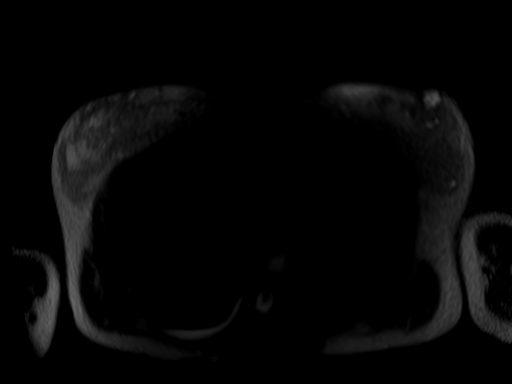

[Series 11: T2 fat-sat · axial · 5.0mm · 0.72mm/px · z∈[-207,+267]mm · 5 of 80 slices shown (1 of 2)]
[im 1/80]
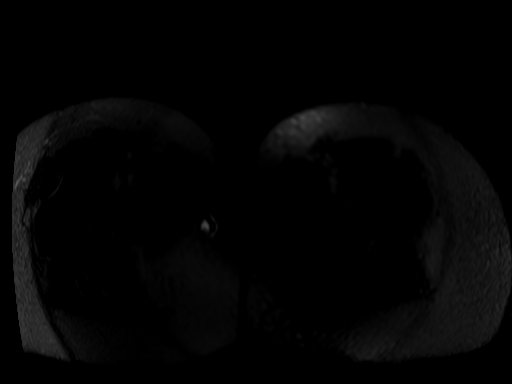
[im 20/80]
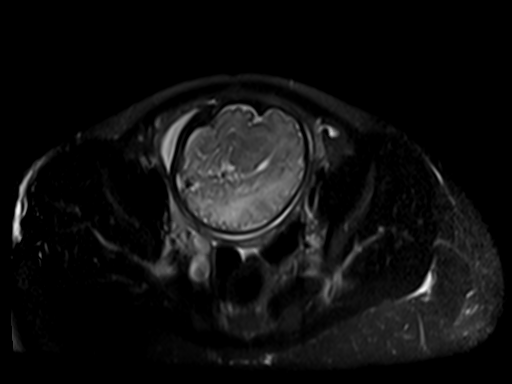
[im 40/80]
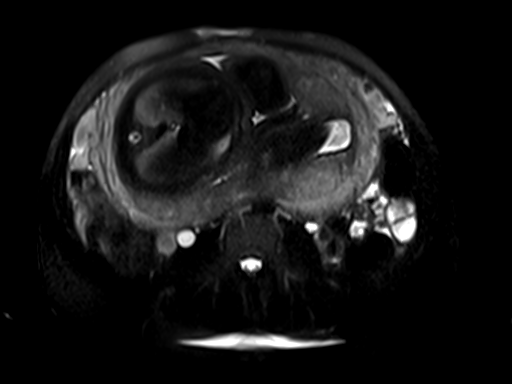
[im 60/80]
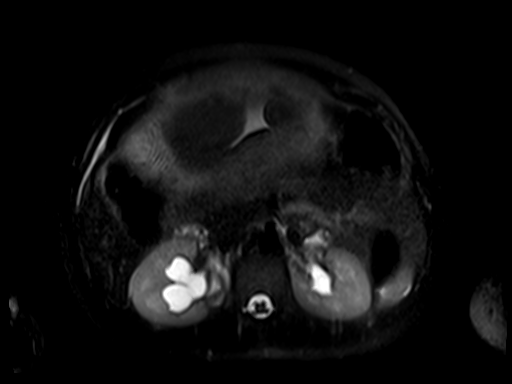
[im 80/80]
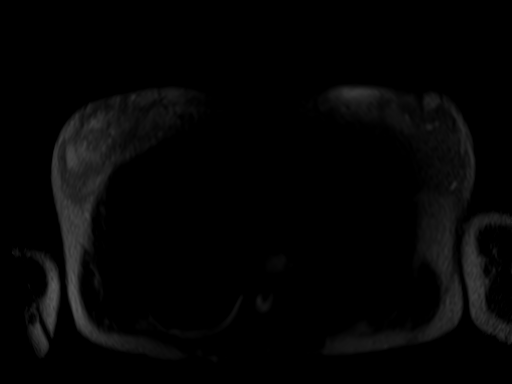

[Series 12: T2 · coronal · 5.0mm · 0.88mm/px · 3 of 46 slices shown (2 of 2)]
[im 1/46]
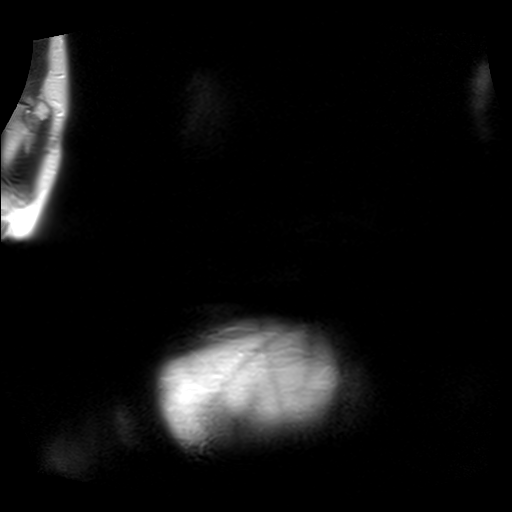
[im 23/46]
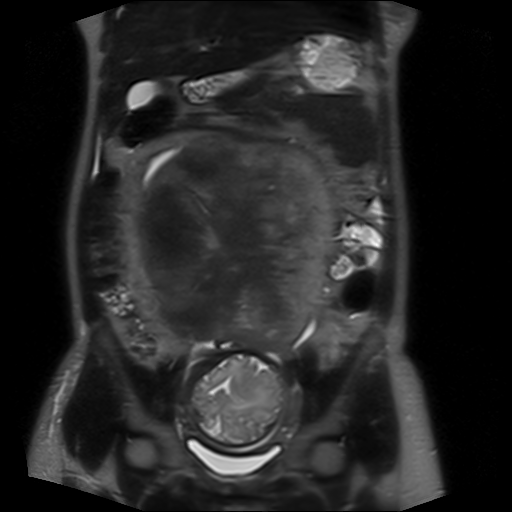
[im 46/46]
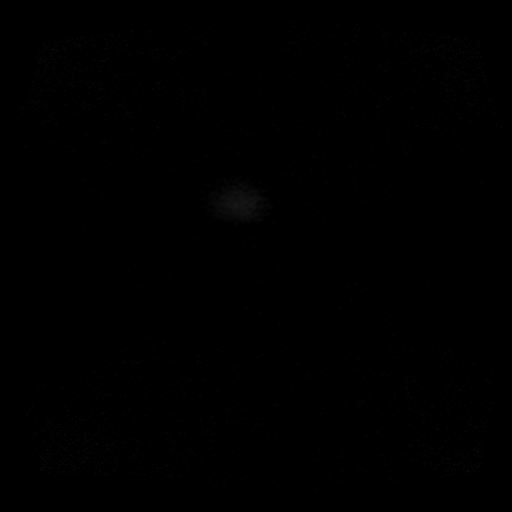

[Series 13: T2 fat-sat · coronal · 5.0mm · 0.82mm/px · 3 of 46 slices shown (2 of 2)]
[im 1/46]
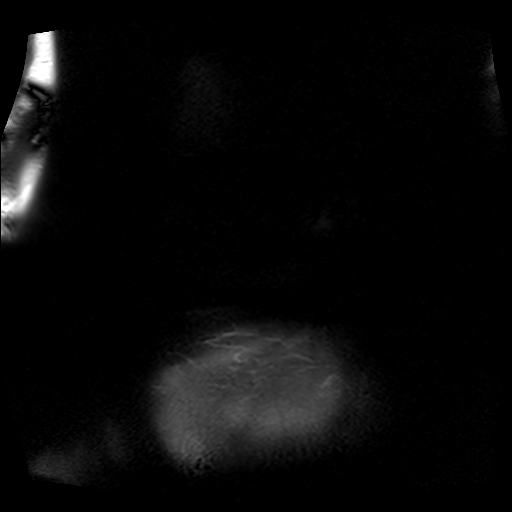
[im 23/46]
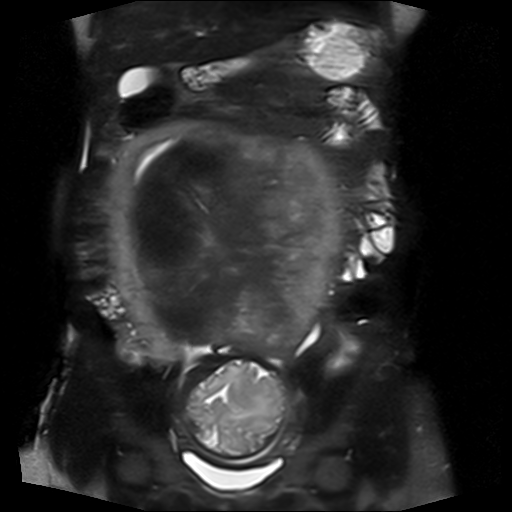
[im 46/46]
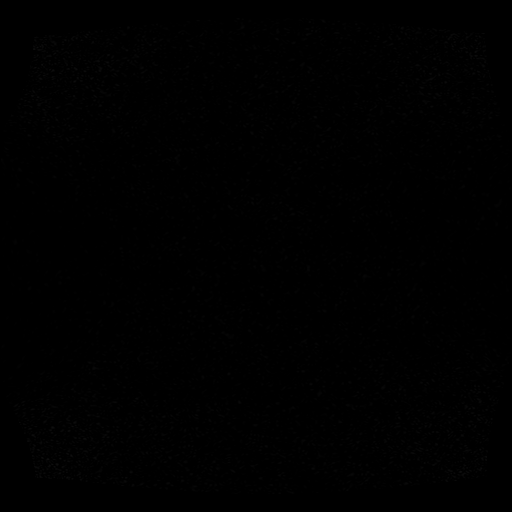

[Series 14: bSSFP · axial · 5.0mm · 0.72mm/px · z∈[-207,+267]mm · 5 of 80 slices shown]
[im 1/80]
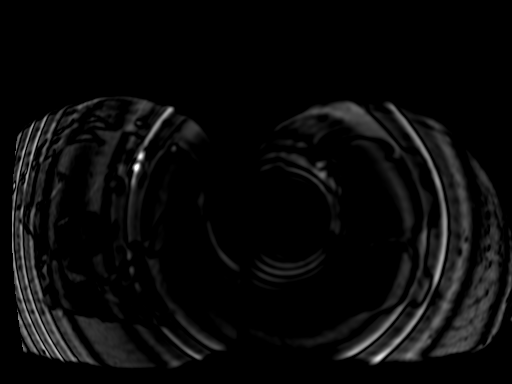
[im 20/80]
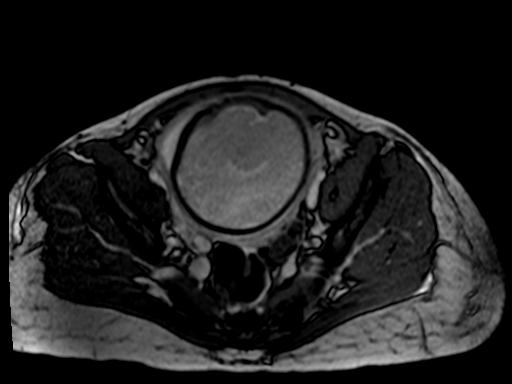
[im 40/80]
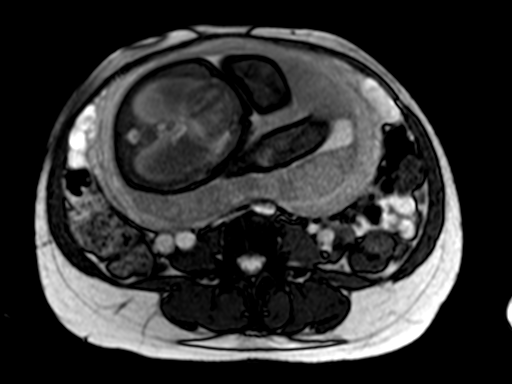
[im 60/80]
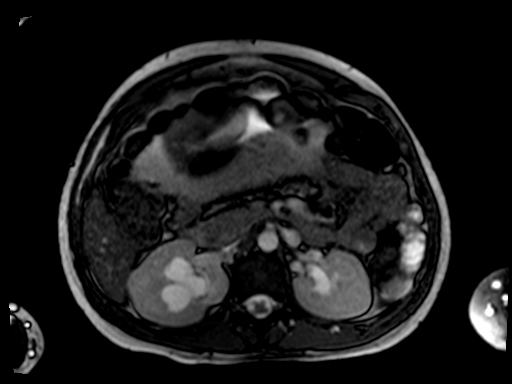
[im 80/80]
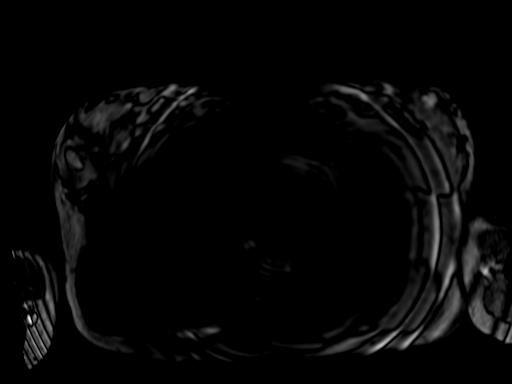

[22 of 48 positions shown; findings below may reference images not displayed]

FINDINGS: COMBINED FINDINGS FOR BOTH MR ABDOMEN AND PELVIS

Lower chest: Unremarkable where included.

Hepatobiliary: The gallbladder appears unremarkable. No biliary
dilatation. On ultrasound there is a focal hyperechoic lesion near
the dome of the right hepatic lobe; a corresponding lesion is not
well seen on today's MRI, although portions of the liver in
particular the dome are excluded due to field of view issues for
including the pelvis as well.

Pancreas:  Unremarkable

Spleen:  Unremarkable

Adrenals/Urinary Tract: Adrenal glands normal. There is moderate to
prominent right mild left hydronephrosis, with hydroureter extending
down to the iliac vessel cross over level for the gravid uterus
overlies the crossing ureter. The distal ureters do not appear
dilated, and the urinary bladder appears normal.

Stomach/Bowel: A variety of tubular structures below the cecum are
most compatible with parametrial vasculature. I do not confidently
identified the appendix. However, I also do not see an obvious
inflamed tubular structure in the right lower quadrant.

Vascular/Lymphatic:  Unremarkable

Reproductive: Gravid uterus noted, single pregnancy in cephalic
presentation. Primarily posterior placenta without compelling
findings of abruption. A small portion of the placenta extends
around the left side to the left anterior margin. No previa. Today's
exam was not protocol for fetal evaluation. The cervix is closed but
accurate cervical length is problematic due to orientation.

Other:  No supplemental non-categorized findings.

Musculoskeletal: There is a small amount of fluid signal interposed
between the right external oblique and internal oblique muscles,
raising the possibility of a muscle strain or local bursitis.
IMPRESSION: 1. Prominent right and mild left hydronephrosis and hydroureter
extending down to the level of the iliac vessel cross over, where
the gravid uterus may be exerting mass effect on the ureters
particularly in the prone position. No distal ureteral dilatation or
bladder dilatation.
2. Non identification of the appendix. However, I do not see an
inflammatory process in the right lower quadrant.
3. Fluid signal intensity tracking between the right external and
internal oblique musculature, possibly reflecting a muscle strain or
small amount of local bursitis along the right anterior abdominal
wall musculature.
4. No correlate for the apparent hyperechoic lesion in the upper
liver is identified on today's MRI, although portions of the dome of
the liver are excluded and motion artifact in this area may reduce
sensitivity. I would suggest repeat ultrasound postpartum to
reassess the liver.

## 2019-07-12 IMAGING — US US ABDOMEN LIMITED
1 series · 14 of 25 positions shown · non-contrast
Comparison: None.

CLINICAL DATA: Third trimester pregnancy, elevated liver enzymes

EXAM:
ULTRASOUND ABDOMEN LIMITED RIGHT UPPER QUADRANT

[Series 1: us abdomen limited · 14 of 56 slices shown]
[im 1/56]
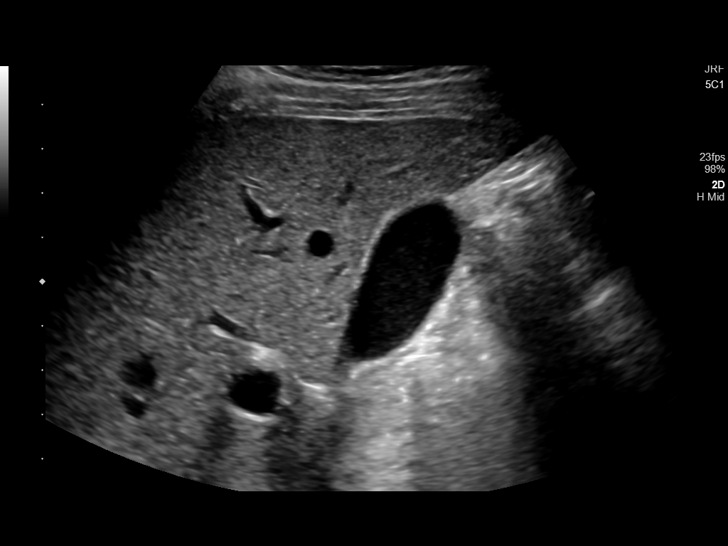
[im 5/56]
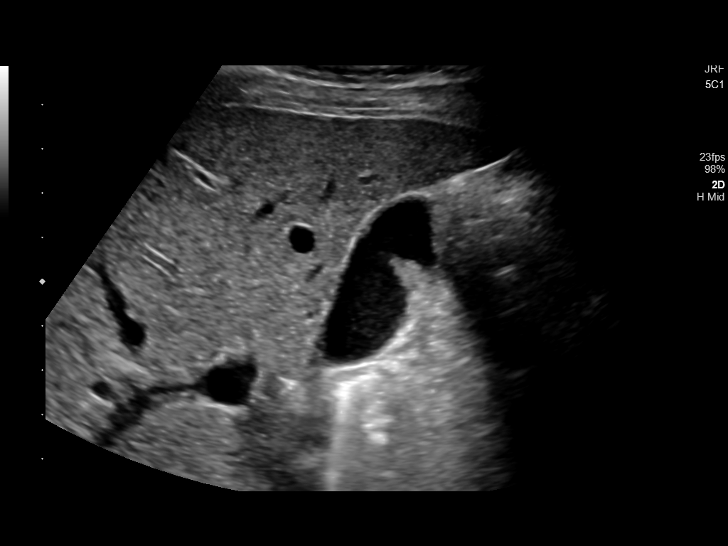
[im 10/56]
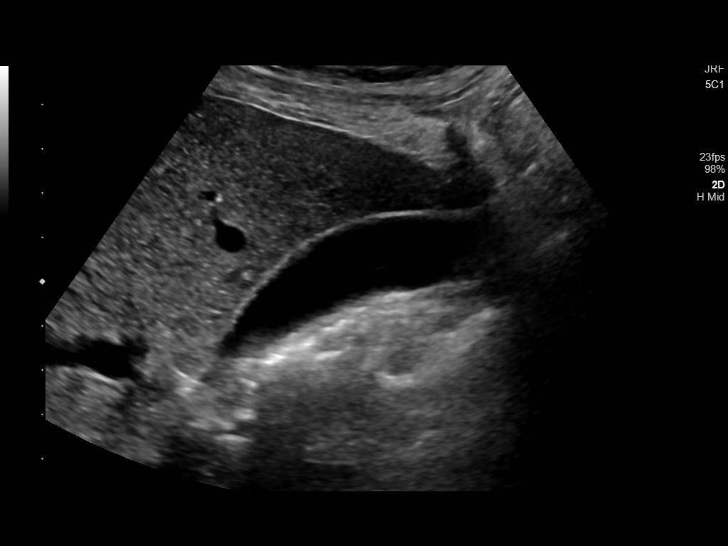
[im 14/56]
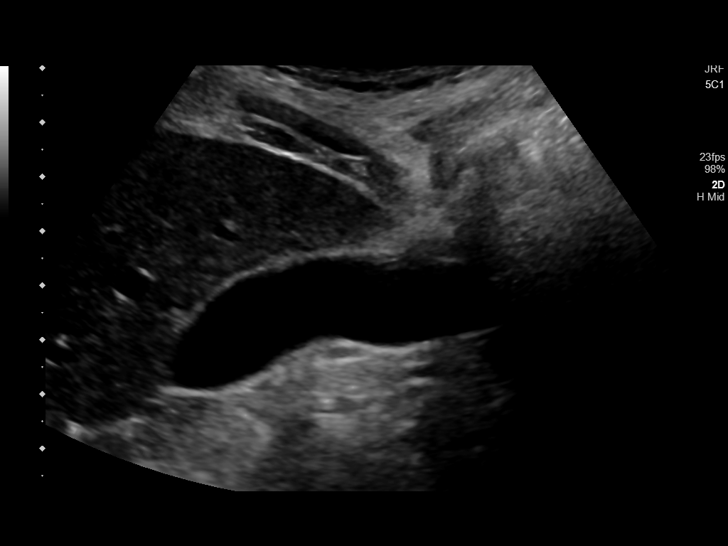
[im 19/56]
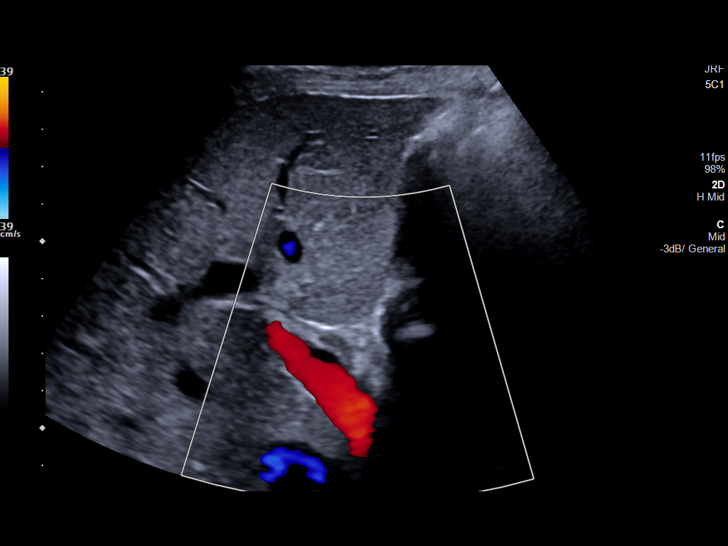
[im 21/56]
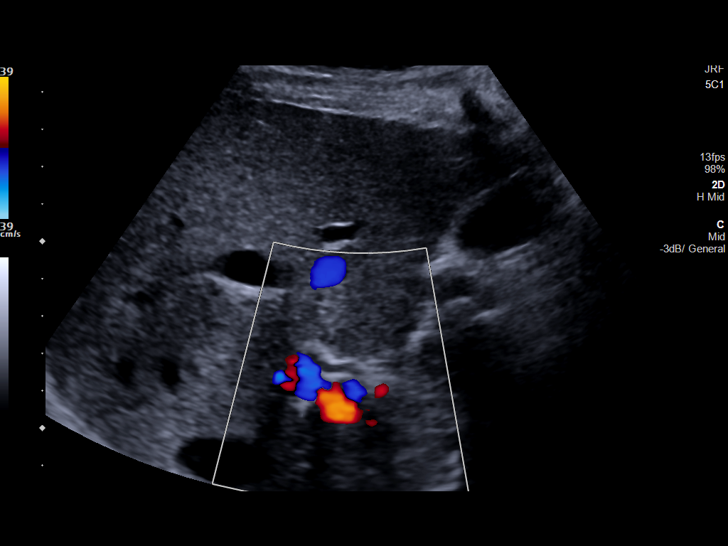
[im 26/56]
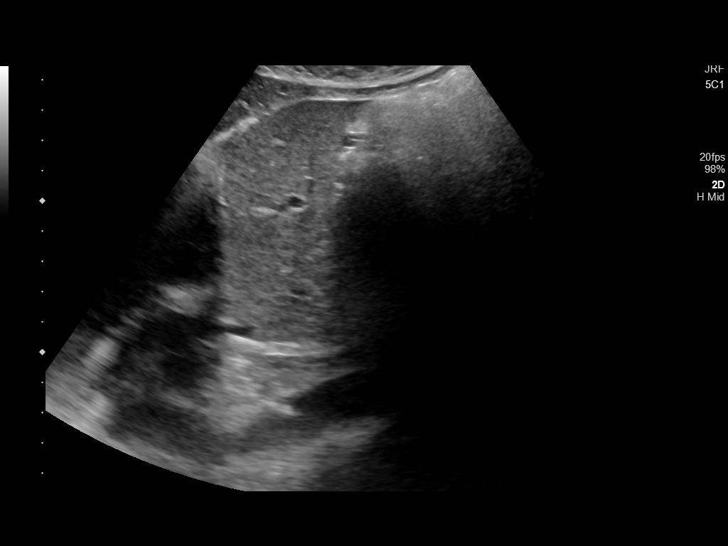
[im 30/56]
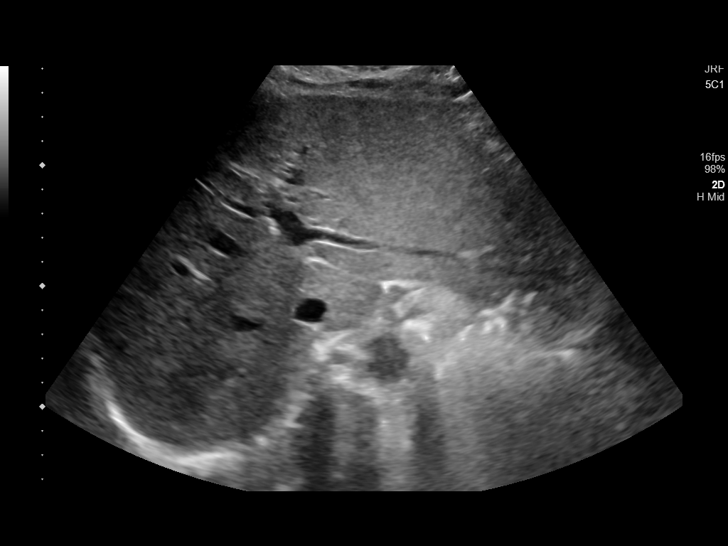
[im 35/56]
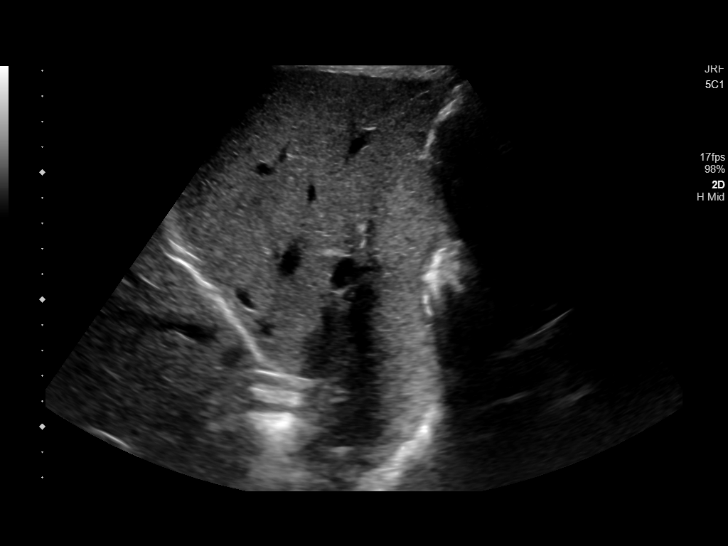
[im 37/56]
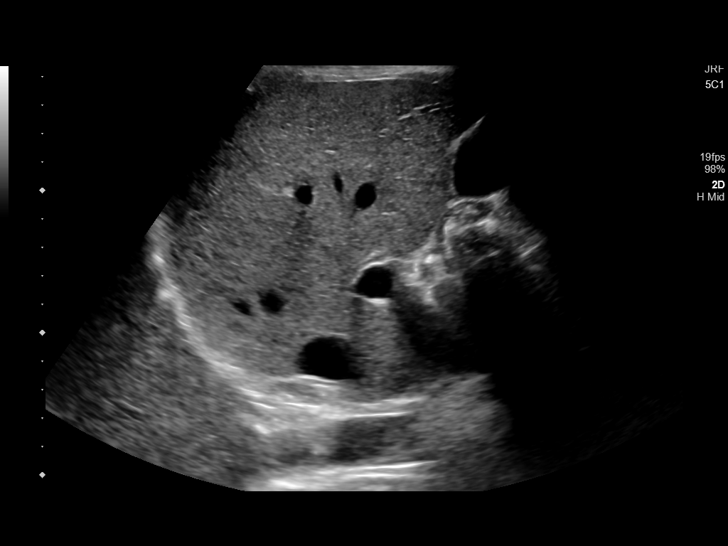
[im 42/56]
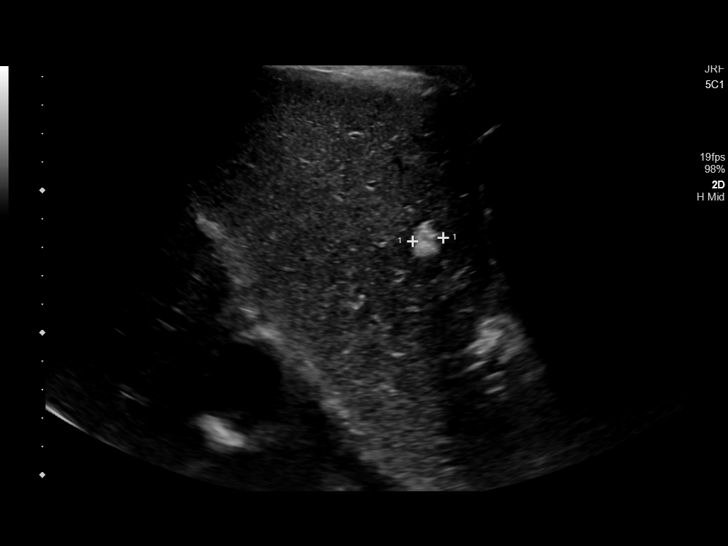
[im 46/56]
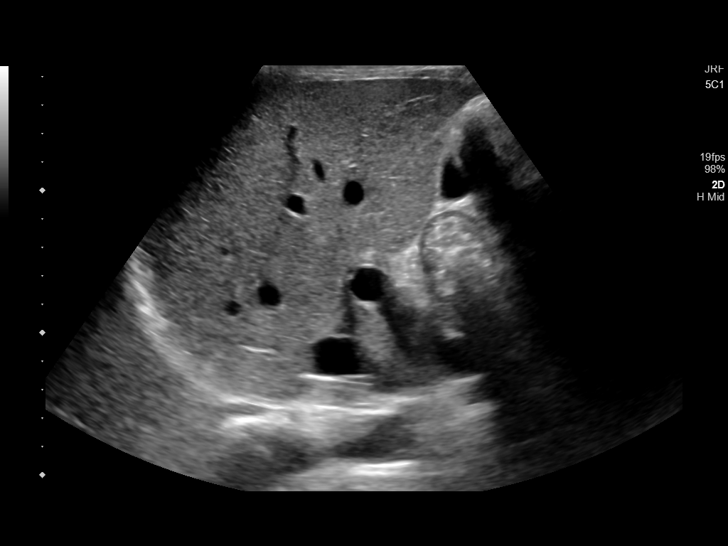
[im 51/56]
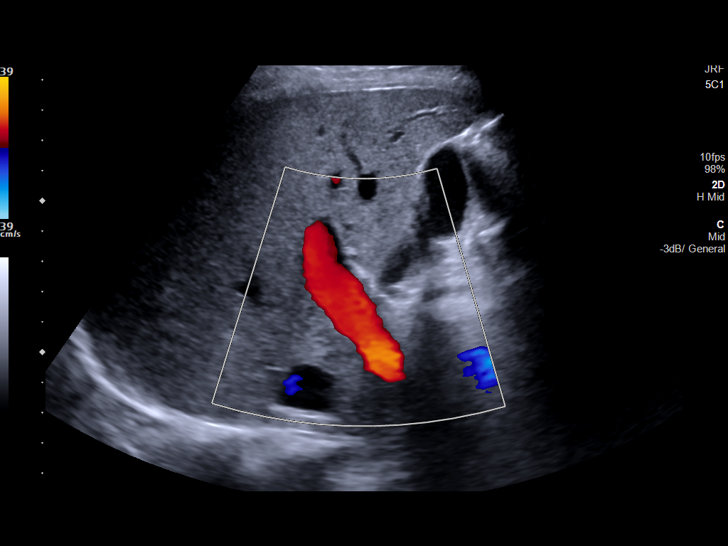
[im 56/56]
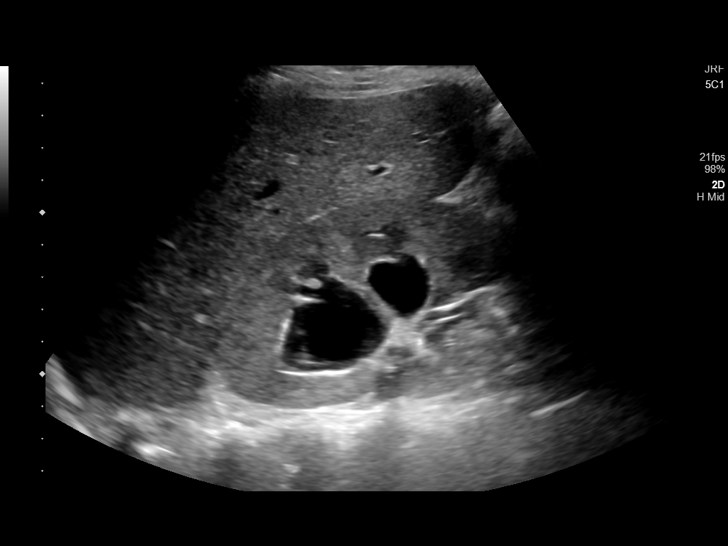

[14 of 25 positions shown; findings below may reference images not displayed]

FINDINGS: Gallbladder:

No gallstones or wall thickening visualized. No sonographic Murphy
sign noted by sonographer.

Common bile duct:

Diameter: 3 mm

Liver:

Echogenic lesion in in the dome of the right hepatic lobe, with
suspicion for potential shadowing on images 43-44.

portal vein is patent on color Doppler imaging with normal direction
of blood flow towards the liver.

Incidental note is made of prominent right hydronephrosis.
IMPRESSION: 1. Prominent right hydronephrosis.
2. No findings of biliary dilatation or gallstones.
3. Small echogenic and possibly shadowing lesion near the dome of
the liver. This could represent a calcification from old
granulomatous disease. The finding was not well correlated on the
recent MRI. If clinically warranted, for example by persistence of
elevated liver enzymes, repeat ultrasound could be performed
postpartum to reassess this lesion.
